# Patient Record
Sex: Male | Born: 1960
Health system: Southern US, Community
[De-identification: ages and names within clinical notes are randomized; demographics above are authoritative.]

## PROBLEM LIST (undated history)

## (undated) DIAGNOSIS — F419 Anxiety disorder, unspecified: Secondary | ICD-10-CM

## (undated) DIAGNOSIS — R310 Gross hematuria: Secondary | ICD-10-CM

## (undated) DIAGNOSIS — M549 Dorsalgia, unspecified: Secondary | ICD-10-CM

## (undated) DIAGNOSIS — M1990 Unspecified osteoarthritis, unspecified site: Secondary | ICD-10-CM

## (undated) HISTORY — DX: Gross hematuria: R31.0

## (undated) HISTORY — DX: Dorsalgia, unspecified: M54.9

## (undated) HISTORY — PX: CERVICAL SPINE SURGERY: SHX589

---

## 2007-09-16 ENCOUNTER — Ambulatory Visit: Payer: Self-pay | Admitting: Surgery

## 2007-09-21 ENCOUNTER — Ambulatory Visit: Payer: Self-pay | Admitting: Surgery

## 2012-06-28 ENCOUNTER — Ambulatory Visit: Payer: Self-pay | Admitting: Urology

## 2012-08-25 ENCOUNTER — Ambulatory Visit: Payer: Self-pay | Admitting: Urology

## 2012-08-30 ENCOUNTER — Ambulatory Visit: Payer: Self-pay | Admitting: Urology

## 2012-09-07 LAB — PATHOLOGY REPORT

## 2013-02-28 ENCOUNTER — Ambulatory Visit: Payer: Self-pay | Admitting: Family Medicine

## 2013-04-07 ENCOUNTER — Ambulatory Visit: Payer: Self-pay | Admitting: Gastroenterology

## 2013-12-15 HISTORY — PX: CARPAL TUNNEL RELEASE: SHX101

## 2014-07-24 ENCOUNTER — Ambulatory Visit: Payer: Self-pay | Admitting: Specialist

## 2015-04-03 NOTE — Op Note (Signed)
PATIENT NAME:  Duane Crawford, Duane Crawford MR#:  [id number] DATE OF BIRTH:  Sep 23, 1961  DATE OF PROCEDURE:  08/30/2012  PREOPERATIVE DIAGNOSIS: Epididymitis   POSTOPERATIVE DIAGNOSIS: Epididymitis.  OPERATION: Epididymectomy.   SURGEON: Rica KoyanagiJohn S. Brailey Buescher, MD  ASSISTANT: Ethlyn Galleryhristi Robson  ANESTHESIA: General.  INDICATIONS: This 54 year old man developed pain in the left testis which has persisted and become more severe. Antibiotics have controlled his symptoms temporarily but anti-inflammatories and antibiotics have failed to resolve his recurrent chronic pain in the left testis. He has a remote history of a vasectomy. After all considerations, he requests epididymectomy.   DESCRIPTION OF PROCEDURE: In the supine position, under general anesthesia, the lower abdomen and genital area was prepped and draped for surgery. An incision was made through the scrotal raphe into the left scrotal compartment exposing the testis and its surrounding structures. The epididymis was markedly engorged, particularly at the globus minor. A good deal of inflammatory fat had developed around the cord and epididymis. The epididymis was dissected from the testis, bleeding points cauterized or tied. Some of the fat was removed along with the epididymis and distal vas. The vas was removed at the site of prior vasectomy. The testis was then replaced in the scrotum and pexed to the septum with 4-0 silk sutures. The scrotal          layers were closed with continuous 4-0 chromic catgut and the scrotal skin with interrupted chromic catgut. A gauze bulky dressing with a scrotal support was applied. The patient tolerated the procedure well and returned to the recovery area in satisfactory condition. ____________________________ Rica KoyanagiJohn S. Levetta Bognar, MD jsh:slb D: 08/30/2012 14:28:43 ET T: 08/30/2012 14:45:47 ET JOB#: [id number]  cc: Rica KoyanagiJohn S. Steffany Schoenfelder, MD, <Dictator> Rica KoyanagiJOHN S Kaylynne Andres MD ELECTRONICALLY SIGNED 08/31/2012 12:33

## 2015-04-07 NOTE — Op Note (Signed)
PATIENT NAME:  Duane Crawford, Duane Crawford MR#:  [id number] DATE OF BIRTH:  1961-08-30  DATE OF PROCEDURE:  07/24/2014  PREOPERATIVE DIAGNOSIS: Advanced right carpal tunnel syndrome.   POSTOPERATIVE DIAGNOSIS:  Advanced right carpal tunnel syndrome.   OPERATION: Right carpal tunnel release.   SURGEON:  Valinda HoarHoward E Haydon Dorris, MD   ASSISTANT:   Cruz CondonAaron Powell , PA student   ANESTHESIA: General LMA.   COMPLICATIONS: None.   DRAINS: None.   ESTIMATED BLOOD LOSS: None.   REPLACED: None.   OPERATIVE PROCEDURE: The patient was brought to the Operating Room where he underwent satisfactory general LMA anesthesia in the supine position. The right arm was prepped and draped in sterile fashion. An Esmarch was applied. The tourniquet was inflated to 300 mmHg, tourniquet time was 23 minutes.   A longitudinal incision was made in the palm just the ulnar side of midline. Dissection was carried out bluntly through subcutaneous tissue, and a periosteal elevator used to free soft tissue off the volar aspect of the ligament. The Kelly clamp was passed beneath the volar ligament to protect the nerve, and the volar carpal ligament was divided sharply with a knife from distal to proximal. Carpal tunnel scissors were used to finish this proximally. The Kelly clamp was spread to make sure all its fibers had been then released. The nerve showed increased vascularity following release. The nerve was freed up from adhesions using a mosquito clamp. The motor branch was intact.  The superficial arc was followed ulnarly, and Guyon's canal was released as well.    The wound was then irrigated, and closed with running 4-0 nylon suture, 0.5 Marcaine was placed in the wound.  A dry, sterile compression hand dressing and volar splint was applied. The tourniquet was deflated with good return of blood flow to the hand.   The patient was awakened and taken to recovery in good condition.    ____________________________ Valinda HoarHoward E. Lisia Westbay,  MD hem:nt D: 07/24/2014 16:20:38 ET T: 07/24/2014 22:53:16 ET JOB#: [id number]  cc: Valinda HoarHoward E. Asmar Brozek, MD, <Dictator> Valinda HoarHOWARD E Daemian Gahm MD ELECTRONICALLY SIGNED 07/25/2014 14:57

## 2015-08-29 ENCOUNTER — Other Ambulatory Visit: Payer: Self-pay | Admitting: Specialist

## 2015-08-29 DIAGNOSIS — M5412 Radiculopathy, cervical region: Secondary | ICD-10-CM

## 2015-08-30 ENCOUNTER — Ambulatory Visit
Admission: RE | Admit: 2015-08-30 | Discharge: 2015-08-30 | Disposition: A | Discharge status: Home / Self Care | Payer: 59 | Source: Ambulatory Visit | Attending: Specialist | Admitting: Specialist

## 2015-08-30 DIAGNOSIS — M5412 Radiculopathy, cervical region: Secondary | ICD-10-CM

## 2015-09-06 ENCOUNTER — Ambulatory Visit
Admission: RE | Admit: 2015-09-06 | Discharge: 2015-09-06 | Disposition: A | Discharge status: Home / Self Care | Payer: 59 | Source: Ambulatory Visit | Attending: Specialist | Admitting: Specialist

## 2015-09-06 DIAGNOSIS — M5022 Other cervical disc displacement, mid-cervical region: Secondary | ICD-10-CM | POA: Insufficient documentation

## 2015-09-06 DIAGNOSIS — M5412 Radiculopathy, cervical region: Secondary | ICD-10-CM | POA: Insufficient documentation

## 2016-07-07 ENCOUNTER — Encounter: Payer: Self-pay | Admitting: Urology

## 2016-07-07 ENCOUNTER — Ambulatory Visit (INDEPENDENT_AMBULATORY_CARE_PROVIDER_SITE_OTHER): Payer: 59 | Admitting: Urology

## 2016-07-07 VITALS — BP 144/91 | HR 78 | Ht 71.0 in | Wt 266.2 lb

## 2016-07-07 DIAGNOSIS — R310 Gross hematuria: Secondary | ICD-10-CM

## 2016-07-07 DIAGNOSIS — Z125 Encounter for screening for malignant neoplasm of prostate: Secondary | ICD-10-CM

## 2016-07-07 LAB — URINALYSIS, COMPLETE
BILIRUBIN UA: NEGATIVE
GLUCOSE, UA: NEGATIVE
KETONES UA: NEGATIVE
NITRITE UA: NEGATIVE
Protein, UA: NEGATIVE
SPEC GRAV UA: 1.02 (ref 1.005–1.030)
UUROB: 1 mg/dL (ref 0.2–1.0)
pH, UA: 5.5 (ref 5.0–7.5)

## 2016-07-07 LAB — MICROSCOPIC EXAMINATION

## 2016-07-07 NOTE — Progress Notes (Signed)
07/07/2016 11:15 AM   Ashok Pall June 03, 1961 [id number]  Referring provider: No referring provider defined for this encounter.  Chief Complaint  Patient presents with  . Hematuria    blood in urine    HPI: Patient is a 55 -year-old Caucasian male who presents today with the complaint of gross hematuria.  He has been experiencing blood in his urine for 2 weeks.    He  does not have a prior history of recurrent urinary tract infections, nephrolithiasis, trauma to the genitourinary tract, BPH or malignancies of the genitourinary tract.   He does have a family medical history of nephrolithiasis, but there is no family history of malignancies of the genitourinary tract or hematuria.   Today, he are having symptoms of frequent urination, urgency, dysuria, nocturia, incontinence, hesitancy, intermittency, straining to urinate and a weak urinary stream.  His UA today demonstrates >30 RBC's/hpf.  He is experiencing right  flank pain. He denies any recent fevers, chills, nausea or vomiting.   He does not have any recent imaging studies.   He is not a smoker.  He is exposed to secondhand smoke.  He is a Psychologist, occupational.     PMH: Past Medical History:  Diagnosis Date  . Back pain   . Gross hematuria     Surgical History: Past Surgical History:  Procedure Laterality Date  . CARPAL TUNNEL RELEASE Right 2015  . CERVICAL SPINE SURGERY      Home Medications:    Medication List       Accurate as of 07/07/16 11:15 AM. Always use your most recent med list.          azithromycin 250 MG tablet Commonly known as:  ZITHROMAX Take 2 tablets (500mg ) by mouth on Day 1. Take 1 tablet (250mg ) by mouth on Days 2-5.   fluticasone 50 MCG/ACT nasal spray Commonly known as:  FLONASE Place into the nose.       Allergies:  Allergies  Allergen Reactions  . Etodolac Rash and Shortness Of Breath    Family History: Family History  Problem Relation Age of Onset  . Prostate cancer Neg  Hx   . Kidney disease Neg Hx     Social History:  reports that he has never smoked. He has never used smokeless tobacco. He reports that he does not drink alcohol or use drugs.  ROS: UROLOGY Frequent Urination?: No Hard to postpone urination?: Yes Burning/pain with urination?: Yes Get up at night to urinate?: No Leakage of urine?: Yes Urine stream starts and stops?: Yes Trouble starting stream?: Yes Do you have to strain to urinate?: Yes Blood in urine?: Yes Urinary tract infection?: No Sexually transmitted disease?: No Injury to kidneys or bladder?: No Painful intercourse?: No Weak stream?: Yes Erection problems?: Yes Penile pain?: No  Gastrointestinal Nausea?: No Vomiting?: No Indigestion/heartburn?: No Diarrhea?: No Constipation?: No  Constitutional Fever: No Night sweats?: No Weight loss?: No Fatigue?: No  Skin Skin rash/lesions?: No Itching?: No  Eyes Blurred vision?: Yes Double vision?: No  Ears/Nose/Throat Sore throat?: No Sinus problems?: Yes  Hematologic/Lymphatic Swollen glands?: No Easy bruising?: No  Cardiovascular Leg swelling?: No Chest pain?: No  Respiratory Cough?: No Shortness of breath?: No  Endocrine Excessive thirst?: No  Musculoskeletal Back pain?: Yes Joint pain?: No  Neurological Headaches?: No Dizziness?: Yes  Psychologic Depression?: No Anxiety?: No  Physical Exam: BP (!) 144/91   Pulse 78   Ht 5\' 11"  (1.803 m)   Wt 266 lb 3.2 oz (  120.7 kg)   BMI 37.13 kg/m   Constitutional: Well nourished. Alert and oriented, No acute distress. HEENT: [place] AT, moist mucus membranes. Trachea midline, no masses. Cardiovascular: No clubbing, cyanosis, or edema. Respiratory: Normal respiratory effort, no increased work of breathing. GI: Abdomen is soft, non tender, non distended, no abdominal masses. Liver and spleen not palpable.  No hernias appreciated.  Stool sample for occult testing is not indicated.   GU: No CVA  tenderness.  No bladder fullness or masses.  Patient with circumcised phallus.  Urethral meatus is patent.  No penile discharge. No penile lesions or rashes. Scrotum without lesions, cysts, rashes and/or edema.  Testicles are located scrotally bilaterally. No masses are appreciated in the testicles. Left and right epididymis are normal. Rectal: Patient with  normal sphincter tone. Anus and perineum without scarring or rashes. No rectal masses are appreciated. Prostate is approximately 55 grams, no nodules are appreciated. Seminal vesicles are normal. Skin: No rashes, bruises or suspicious lesions. Lymph: No cervical or inguinal adenopathy. Neurologic: Grossly intact, no focal deficits, moving all 4 extremities. Psychiatric: Normal mood and affect.  Laboratory Data: Urinalysis Significant for >30RBC's/hpf.  See EPIC.    Assessment & Plan:    1. Gross hematuria:    I explained to the patient that there are a number of causes that can be associated with blood in the urine, such as stones, BPH, UTI's, damage to the urinary tract and/or cancer.  At this time, I felt that the patient warranted further urologic evaluation.   The AUA guidelines state that a CT urogram is the preferred imaging study to evaluate hematuria.  I explained to the patient that a contrast material will be injected into a vein and that in rare instances, an allergic reaction can result and may even life threatening   The patient denies any allergies to contrast, iodine and/or seafood and is not taking metformin.  Following the imaging study,  I've recommended a cystoscopy. I described how this is performed, typically in an office setting with a flexible cystoscope. We described the risks, benefits, and possible side effects, the most common of which is a minor amount of blood in the urine and/or burning which usually resolves in 24 to 48 hours.    The patient had the opportunity to ask questions which were answered. Based upon  this discussion, the patient is willing to proceed. Therefore, I've ordered: a CT Urogram and cystoscopy.  He will return following all of the above for discussion of the results.     2. PSA screening:   Patient has not had a PSA in over one year.  He would like one drawn today.     Return for CT Urogram report and cystoscopy.  These notes generated with voice recognition software. I apologize for typographical errors.  Michiel Cowboy, PA-C  [facility] [address] [place], [place] [address] [phone]

## 2016-07-07 NOTE — Patient Instructions (Addendum)
Hematuria, Adult Hematuria is blood in your urine. It can be caused by a bladder infection, kidney infection, prostate infection, kidney stone, or cancer of your urinary tract. Infections can usually be treated with medicine, and a kidney stone usually will pass through your urine. If neither of these is the cause of your hematuria, further workup to find out the reason may be needed. It is very important that you tell your health care provider about any blood you see in your urine, even if the blood stops without treatment or happens without causing pain. Blood in your urine that happens and then stops and then happens again can be a symptom of a very serious condition. Also, pain is not a symptom in the initial stages of many urinary cancers. HOME CARE INSTRUCTIONS   Drink lots of fluid, 3-4 quarts a day. If you have been diagnosed with an infection, cranberry juice is especially recommended, in addition to large amounts of water.  Avoid caffeine, tea, and carbonated beverages because they tend to irritate the bladder.  Avoid alcohol because it may irritate the prostate.  Take all medicines as directed by your health care provider.  If you were prescribed an antibiotic medicine, finish it all even if you start to feel better.  If you have been diagnosed with a kidney stone, follow your health care provider's instructions regarding straining your urine to catch the stone.  Empty your bladder often. Avoid holding urine for long periods of time.  After a bowel movement, women should cleanse front to back. Use each tissue only once.  Empty your bladder before and after sexual intercourse if you are a male. SEEK MEDICAL CARE IF:  You develop back pain.  You have a fever.  You have a feeling of sickness in your stomach (nausea) or vomiting.  Your symptoms are not better in 3 days. Return sooner if you are getting worse. SEEK IMMEDIATE MEDICAL CARE IF:   You develop severe vomiting and  are unable to keep the medicine down.  You develop severe back or abdominal pain despite taking your medicines.  You begin passing a large amount of blood or clots in your urine.  You feel extremely weak or faint, or you pass out. MAKE SURE YOU:   Understand these instructions.  Will watch your condition.  Will get help right away if you are not doing well or get worse.   This information is not intended to replace advice given to you by your health care provider. Make sure you discuss any questions you have with your health care provider.   Document Released: 12/01/2005 Document Revised: 12/22/2014 Document Reviewed: 08/01/2013 Elsevier Interactive Patient Education 2016 [employer]  CT Scan A computed tomography (CT) scan is a specialized X-ray scan. It uses X-rays and a computer to make pictures of different areas of your body. A CT scan can offer more detailed information than a regular X-ray exam. The CT scan provides data about internal organs, soft tissue structures, blood vessels, and bones.  The CT scanner is a large machine that takes pictures of your body as you move through the opening.  LET YOUR HEALTH CARE PROVIDER KNOW ABOUT:  Any allergies you have.   All medicines you are taking, including vitamins, herbs, eye drops, creams, and over-the-counter medicines.   Previous problems you or members of your family have had with the use of anesthetics.   Any blood disorders you have.   Previous surgeries you have had.   Medical   conditions you have. RISKS AND COMPLICATIONS  Generally, this is a safe procedure. However, as with any procedure, problems can occur. Possible problems include:   An allergic reaction to the contrast material.   Development of cancer from excessive exposure to radiation. The risk of this is small.  BEFORE THE PROCEDURE   The day before the test, stop drinking caffeinated beverages. These include energy drinks, tea, soda, coffee,  and hot chocolate.   On the day of the test:  About 4 hours before the test, stop eating and drinking anything but water as advised by your health care provider.   Avoid wearing jewelry. You will have to partly or fully undress and wear a hospital gown. PROCEDURE   You will be asked to lie on a table with your arms above your head.   If contrast dye is to be used for the test, an IV tube will be inserted in your arm. The contrast dye will be injected into the IV tube. You might feel warm, or you may get a metallic taste in your mouth.   The table you will be lying on will move into a large machine that will do the scanning.   You will be able to see, hear, and talk to the person running the machine while you are in it. Follow that person's directions.   The CT machine will move around you to take pictures. Do not move while it is scanning. This helps to get a good image.   When the best possible pictures have been taken, the machine will be turned off. The table will be moved out of the machine. The IV tube will then be removed. AFTER THE PROCEDURE  Ask your health care provider when to follow up for your test results.   This information is not intended to replace advice given to you by your health care provider. Make sure you discuss any questions you have with your health care provider.   Document Released: 01/08/2005 Document Revised: 12/06/2013 Document Reviewed: 08/08/2013 Elsevier Interactive Patient Education 2016 Else[employer] Inc.  Cystoscopy Cystoscopy is a procedure that is used to help your caregiver diagnose and sometimes treat conditions that affect your lower urinary tract. Your lower urinary tract includes your bladder and the tube through which urine passes from your bladder out of your body (urethra). Cystoscopy is performed with a thin, tube-shaped instrument (cystoscope). The cystoscope has lenses and a light at the end so that your caregiver can see inside your  bladder. The cystoscope is inserted at the entrance of your urethra. Your caregiver guides it through your urethra and into your bladder. There are two main types of cystoscopy:  Flexible cystoscopy (with a flexible cystoscope).  Rigid cystoscopy (with a rigid cystoscope). Cystoscopy may be recommended for many conditions, including:  Urinary tract infections.  Blood in your urine (hematuria).  Loss of bladder control (urinary incontinence) or overactive bladder.  Unusual cells found in a urine sample.  Urinary blockage.  Painful urination. Cystoscopy may also be done to remove a sample of your tissue to be checked under a microscope (biopsy). It may also be done to remove or destroy bladder stones. LET YOUR CAREGIVER KNOW ABOUT:  Allergies to food or medicine.  Medicines taken, including vitamins, herbs, eyedrops, over-the-counter medicines, and creams.  Use of steroids (by mouth or creams).  Previous problems with anesthetics or numbing medicines.  History of bleeding problems or blood clots.  Previous surgery.  Other health problems, including diabetes and   kidney problems.  Possibility of pregnancy, if this applies. PROCEDURE The area around the opening to your urethra will be cleaned. A medicine to numb your urethra (local anesthetic) is used. If a tissue sample or stone is removed during the procedure, you may be given a medicine to make you sleep (general anesthetic). Your caregiver will gently insert the tip of the cystoscope into your urethra. The cystoscope will be slowly glided through your urethra and into your bladder. Sterile fluid will flow through the cystoscope and into your bladder. The fluid will expand and stretch your bladder. This gives your caregiver a better view of your bladder walls. The procedure lasts about 15-20 minutes. AFTER THE PROCEDURE If a local anesthetic is used, you will be allowed to go home as soon as you are ready. If a general  anesthetic is used, you will be taken to a recovery area until you are stable. You may have temporary bleeding and burning on urination.   This information is not intended to replace advice given to you by your health care provider. Make sure you discuss any questions you have with your health care provider.   Document Released: 11/28/2000 Document Revised: 12/22/2014 Document Reviewed: 05/24/2012 Elsevier Interactive Patient Education 2016 Elsevier Inc.  

## 2016-07-08 ENCOUNTER — Telehealth: Payer: Self-pay

## 2016-07-08 LAB — BUN+CREAT
BUN/Creatinine Ratio: 15 (ref 9–20)
BUN: 13 mg/dL (ref 6–24)
Creatinine, Ser: 0.85 mg/dL (ref 0.76–1.27)
GFR calc Af Amer: 115 mL/min/{1.73_m2} (ref >59)
GFR calc non Af Amer: 99 mL/min/{1.73_m2} (ref >59)

## 2016-07-08 LAB — PSA: Prostate Specific Ag, Serum: 0.6 ng/mL (ref 0.0–4.0)

## 2016-07-08 NOTE — Telephone Encounter (Signed)
-----   Message from Shannon A McGowan, PA-C sent at 07/08/2016  8:10 AM EDT ----- Labs are normal.  Proceed with CT Urogram. 

## 2016-07-08 NOTE — Telephone Encounter (Signed)
Mailbox full

## 2016-07-09 LAB — CULTURE, URINE COMPREHENSIVE

## 2016-07-09 NOTE — Telephone Encounter (Signed)
No answer. Mailbox full. 

## 2016-07-15 NOTE — Telephone Encounter (Signed)
-----   Message from Harle Battiest, PA-C sent at 07/08/2016  8:10 AM EDT ----- Labs are normal.  Proceed with CT Urogram.

## 2016-07-15 NOTE — Telephone Encounter (Signed)
Can't get in touch with patient message box always full and can't leave message Letter to contact office sent.

## 2016-07-21 ENCOUNTER — Telehealth: Payer: Self-pay | Admitting: Urology

## 2016-07-21 ENCOUNTER — Ambulatory Visit
Admission: RE | Admit: 2016-07-21 | Discharge: 2016-07-21 | Disposition: A | Discharge status: Home / Self Care | Payer: 59 | Source: Ambulatory Visit | Attending: Urology | Admitting: Urology

## 2016-07-21 DIAGNOSIS — R310 Gross hematuria: Secondary | ICD-10-CM | POA: Diagnosis present

## 2016-07-21 DIAGNOSIS — N289 Disorder of kidney and ureter, unspecified: Secondary | ICD-10-CM | POA: Insufficient documentation

## 2016-07-21 DIAGNOSIS — I700 Atherosclerosis of aorta: Secondary | ICD-10-CM | POA: Insufficient documentation

## 2016-07-21 MED ORDER — IOPAMIDOL (ISOVUE-300) INJECTION 61%
125.0000 mL | Freq: Once | INTRAVENOUS | Status: AC | PRN
  Administered 2016-07-21: 125 mL via INTRAVENOUS

## 2016-07-21 NOTE — Telephone Encounter (Signed)
Spoke with patient and he is scheduled for his ct scan today and will have cysto on 07-25-16  michelle

## 2016-07-25 ENCOUNTER — Ambulatory Visit (INDEPENDENT_AMBULATORY_CARE_PROVIDER_SITE_OTHER): Payer: 59 | Admitting: Urology

## 2016-07-25 ENCOUNTER — Encounter: Payer: Self-pay | Admitting: Urology

## 2016-07-25 VITALS — BP 128/82 | HR 75 | Ht 71.0 in | Wt 271.2 lb

## 2016-07-25 DIAGNOSIS — R310 Gross hematuria: Secondary | ICD-10-CM

## 2016-07-25 DIAGNOSIS — Z125 Encounter for screening for malignant neoplasm of prostate: Secondary | ICD-10-CM | POA: Diagnosis not present

## 2016-07-25 DIAGNOSIS — N400 Enlarged prostate without lower urinary tract symptoms: Secondary | ICD-10-CM | POA: Diagnosis not present

## 2016-07-25 LAB — URINALYSIS, COMPLETE
BILIRUBIN UA: NEGATIVE
Glucose, UA: NEGATIVE
KETONES UA: NEGATIVE
LEUKOCYTES UA: NEGATIVE
Nitrite, UA: NEGATIVE
PROTEIN UA: NEGATIVE
SPEC GRAV UA: 1.015 (ref 1.005–1.030)
Urobilinogen, Ur: 0.2 mg/dL (ref 0.2–1.0)
pH, UA: 8.5 — ABNORMAL HIGH (ref 5.0–7.5)

## 2016-07-25 LAB — MICROSCOPIC EXAMINATION: Bacteria, UA: NONE SEEN

## 2016-07-25 MED ORDER — LIDOCAINE HCL 2 % EX GEL
1.0000 "application " | Freq: Once | CUTANEOUS | Status: AC
  Administered 2016-07-25: 1 via URETHRAL

## 2016-07-25 MED ORDER — FINASTERIDE 5 MG PO TABS
5.0000 mg | ORAL_TABLET | Freq: Every day | ORAL | 11 refills | Status: DC
Start: 2016-07-25 — End: 2019-12-06

## 2016-07-25 MED ORDER — CIPROFLOXACIN HCL 500 MG PO TABS
500.0000 mg | ORAL_TABLET | Freq: Once | ORAL | Status: AC
  Administered 2016-07-25: 500 mg via ORAL

## 2016-07-25 NOTE — Progress Notes (Signed)
07/25/2016 10:59 AM   Ashok Pall [birth year] [id number]  Referring provider: Danella Penton, MD [phone] [facility] [address] [facility] [place], [place] [address]  Chief Complaint  Patient presents with  . Cysto    HPI: Patient is a 55 -year-old Caucasian male who presents today with the complaint of gross hematuria.  He has been experiencing blood in his urine for 2 weeks.    He  does not have a prior history of recurrent urinary tract infections, nephrolithiasis, trauma to the genitourinary tract, BPH or malignancies of the genitourinary tract.   He does have a family medical history of nephrolithiasis, but there is no family history of malignancies of the genitourinary tract or hematuria.   Today, he are having symptoms of frequent urination, urgency, dysuria, nocturia, incontinence, hesitancy, intermittency, straining to urinate and a weak urinary stream.  His UA today demonstrates >30 RBC's/hpf.  He is experiencing right  flank pain. He denies any recent fevers, chills, nausea or vomiting.   He does not have any recent imaging studies.   He is not a smoker.  He is exposed to secondhand smoke.  He is a Psychologist, occupational.    CT Urogram was unremarkable for source of gross hematuria.    The patient has a new complaint also of nocturia 23. He also has a weak stream. He does not feel His bladder. This is beginning worse over a number of years. He has never taken medications for BPH before.   PMH: Past Medical History:  Diagnosis Date  . Back pain   . Gross hematuria     Surgical History: Past Surgical History:  Procedure Laterality Date  . CARPAL TUNNEL RELEASE Right 2015  . CERVICAL SPINE SURGERY      Home Medications:    Medication List       Accurate as of 07/25/16 10:59 AM. Always use your most recent med list.          azithromycin 250 MG tablet Commonly known as:  ZITHROMAX Take 2 tablets (500mg ) by mouth on Day 1. Take 1 tablet (250mg )  by mouth on Days 2-5.   finasteride 5 MG tablet Commonly known as:  PROSCAR Take 1 tablet (5 mg total) by mouth daily.   fluticasone 50 MCG/ACT nasal spray Commonly known as:  FLONASE Place into the nose.       Allergies:  Allergies  Allergen Reactions  . Etodolac Rash and Shortness Of Breath    Family History: Family History  Problem Relation Age of Onset  . Prostate cancer Neg Hx   . Kidney disease Neg Hx     Social History:  reports that he has never smoked. He has never used smokeless tobacco. He reports that he does not drink alcohol or use drugs.  ROS:                                        Physical Exam: BP 128/82 (BP Location: Right Arm, Patient Position: Sitting, Cuff Size: Large)   Pulse 75   Ht 5\' 11"  (1.803 m)   Wt 271 lb 3.2 oz (123 kg)   BMI 37.82 kg/m   Constitutional:  Alert and oriented, No acute distress. HEENT: [place] AT, moist mucus membranes.  Trachea midline, no masses. Cardiovascular: No clubbing, cyanosis, or edema. Respiratory: Normal respiratory effort, no increased work of breathing. GI: Abdomen is soft, nontender,  nondistended, no abdominal masses GU: No CVA tenderness.  Skin: No rashes, bruises or suspicious lesions. Lymph: No cervical or inguinal adenopathy. Neurologic: Grossly intact, no focal deficits, moving all 4 extremities. Psychiatric: Normal mood and affect.  Laboratory Data: No results found for: WBC, HGB, HCT, MCV, PLT  Lab Results  Component Value Date   CREATININE 0.85 07/07/2016    No results found for: PSA  No results found for: TESTOSTERONE  No results found for: HGBA1C  Urinalysis    Component Value Date/Time   APPEARANCEUR Cloudy (A) 07/07/2016 1100   GLUCOSEU Negative 07/07/2016 1100   BILIRUBINUR Negative 07/07/2016 1100   PROTEINUR Negative 07/07/2016 1100   NITRITE Negative 07/07/2016 1100   LEUKOCYTESUR Trace (A) 07/07/2016 1100    Pertinent Imaging: CLINICAL DATA:   55 year old male with history of gross hematuria and low back pain for the past 4 weeks, recently resolved.  EXAM: CT ABDOMEN AND PELVIS WITHOUT AND WITH CONTRAST  TECHNIQUE: Multidetector CT imaging of the abdomen and pelvis was performed following the standard protocol before and following the bolus administration of intravenous contrast.  CONTRAST:  ISOVUE-300 IOPAMIDOL (ISOVUE-300) INJECTION 61%  COMPARISON:  No priors.  FINDINGS: Lower chest:  Unremarkable.  Hepatobiliary: Several tiny calcified granulomas are noted scattered throughout the liver. No suspicious cystic or solid hepatic lesions. No intra or extrahepatic biliary ductal dilatation. Gallbladder is normal in appearance.  Pancreas: No pancreatic mass. No pancreatic ductal dilatation. No pancreatic or peripancreatic fluid or inflammatory changes.  Spleen: Unremarkable.  Adrenals/Urinary Tract: No calcifications are noted within the collecting system of either kidney, along the course of either ureter, or within the lumen of the urinary bladder. No hydroureteronephrosis or perinephric stranding to indicate urinary tract obstruction at this time. 6 mm and low-attenuation lesion in the anterior aspect of the lower pole of the left kidney is too small to definitively characterize, but is statistically likely a tiny cyst. No other suspicious renal lesions are noted. Postcontrast delayed images demonstrate no definite filling defect within the collecting system of either kidney, along the course of either ureter, or within the lumen of the urinary bladder. Urinary bladder is normal in appearance. Bilateral adrenal glands are normal in appearance.  Stomach/Bowel: Normal appearance of the stomach. Small duodenal diverticulum off the medial aspect of the second portion of the duodenum incidentally noted. No surrounding inflammatory changes. No pathologic dilatation of small bowel or colon. Normal  appendix.  Vascular/Lymphatic: Aortic atherosclerosis, without evidence of aneurysm or dissection in the abdominal or pelvic vasculature. No lymphadenopathy noted in the abdomen or pelvis.  Reproductive: Prostate gland and seminal vesicles are unremarkable in appearance.  Other: No significant volume of ascites.  No pneumoperitoneum.  Musculoskeletal: There are no aggressive appearing lytic or blastic lesions noted in the visualized portions of the skeleton.  IMPRESSION: 1. No definite source for gross hematuria identified on today's examination. 2. No acute findings in the abdomen or pelvis to account for the patient's history of back pain. 3. 6 mm low-attenuation lesion in the anterior aspect of the lower pole the left kidney is too small to definitively characterize, but is statistically likely a tiny cyst. 4. Aortic atherosclerosis. 5. Normal appendix. 6. Additional incidental findings, as above.   Cystoscopy Procedure Note  Patient identification was confirmed, informed consent was obtained, and patient was prepped using Betadine solution.  Lidocaine jelly was administered per urethral meatus.    Preoperative abx where received prior to procedure.     Pre-Procedure: - Inspection  reveals a normal caliber ureteral meatus.  Procedure: The flexible cystoscope was introduced without difficulty - No urethral strictures/lesions are present. - Enlarged prostate  - Hypervascular prostate - Normal bladder neck - Bilateral ureteral orifices identified - Bladder mucosa  reveals no ulcers, tumors, or lesions - No bladder stones - No trabeculation  Retroflexion shows no intravesical lobe.    Post-Procedure: - Patient tolerated the procedure well   Assessment & Plan:    1. Gross hematuria -Negative work up except for hypervascular prostate -Will need repeat urinalysis in one year  2. BPH -We'll start the patient on finasteride for his urinary symptoms. This  will not only help with his urinary symptoms but his hypervascular prostate. -Follow up in 3 months to assess his progress.  3. Prostate cancer screening Up today. Will need repeat PSA/DRE in July 2018.  Return in about 3 months (around 10/25/2016).  Hildred LaserBrian James Tameah Mihalko, MD  [facility] [address], [address] [place], [place] [address] [phone]

## 2016-10-23 ENCOUNTER — Ambulatory Visit (INDEPENDENT_AMBULATORY_CARE_PROVIDER_SITE_OTHER): Payer: 59 | Admitting: Urology

## 2016-10-23 ENCOUNTER — Encounter: Payer: Self-pay | Admitting: Urology

## 2016-10-23 VITALS — BP 150/95 | HR 85 | Ht 71.0 in | Wt 260.4 lb

## 2016-10-23 DIAGNOSIS — N400 Enlarged prostate without lower urinary tract symptoms: Secondary | ICD-10-CM

## 2016-10-23 LAB — URINALYSIS, COMPLETE
BILIRUBIN UA: NEGATIVE
Glucose, UA: NEGATIVE
KETONES UA: NEGATIVE
Leukocytes, UA: NEGATIVE
NITRITE UA: NEGATIVE
PH UA: 8.5 — AB (ref 5.0–7.5)
Protein, UA: NEGATIVE
RBC UA: NEGATIVE
SPEC GRAV UA: 1.02 (ref 1.005–1.030)
UUROB: 0.2 mg/dL (ref 0.2–1.0)

## 2016-10-23 LAB — BLADDER SCAN AMB NON-IMAGING: Scan Result: 0

## 2016-10-23 LAB — MICROSCOPIC EXAMINATION: BACTERIA UA: NONE SEEN

## 2016-10-23 MED ORDER — TAMSULOSIN HCL 0.4 MG PO CAPS: 0.4000 mg | ORAL_CAPSULE | Freq: Every day | ORAL | 11 refills | Status: DC

## 2016-10-23 NOTE — Progress Notes (Signed)
10/23/2016 9:16 AM   Duane Crawford 08/11/1961 [id number]  Referring provider: Danella PentonMark F Miller, MD [phone] [id number] [facility] [address] [facility] [place], [place] [id number]  Chief Complaint  Patient presents with  . Benign Prostatic Hypertrophy    HPI: The patient is a 55 year old gentleman who presents for follow-up. Next  1. BPH The patient was started on finasteride for his urinary symptoms in August 2017. I PSS today is 13/6. Complains mostly of intermittency and weak stream. He has nocturia 1. He has some incomplete emptying and urgency. He has mild frequency and straining. He rates his quality of life from urinary symptoms as terrible. He does note mild improvement though with the finasteride but feels his urinary symptoms could be better.  2. Gross hematuria  -negative workup in August 2017 except for a hypervascular prostate. He was placed on finasteride at this time for both his hypervascular prostate causing gross hematuria and his urinary symptoms.  3. Prostate cancer screening Due for repeat PSA and DRE in July 2018.   PMH: Past Medical History:  Diagnosis Date  . Back pain   . Gross hematuria     Surgical History: Past Surgical History:  Procedure Laterality Date  . CARPAL TUNNEL RELEASE Right 2015  . CERVICAL SPINE SURGERY      Home Medications:    Medication List       Accurate as of 10/23/16  9:16 AM. Always use your most recent med list.          azithromycin 250 MG tablet Commonly known as:  ZITHROMAX Take 2 tablets (500mg ) by mouth on Day 1. Take 1 tablet (250mg ) by mouth on Days 2-5.   finasteride 5 MG tablet Commonly known as:  PROSCAR Take 1 tablet (5 mg total) by mouth daily.   fluticasone 50 MCG/ACT nasal spray Commonly known as:  FLONASE Place into the nose.   tamsulosin 0.4 MG Caps capsule Commonly known as:  FLOMAX Take 1 capsule (0.4 mg total) by mouth daily.       Allergies:  Allergies  Allergen Reactions  .  Etodolac Rash and Shortness Of Breath    Family History: Family History  Problem Relation Age of Onset  . Prostate cancer Neg Hx   . Kidney disease Neg Hx     Social History:  reports that he has never smoked. He has never used smokeless tobacco. He reports that he does not drink alcohol or use drugs.  ROS: UROLOGY Frequent Urination?: No Hard to postpone urination?: No Burning/pain with urination?: No Get up at night to urinate?: No Leakage of urine?: No Urine stream starts and stops?: No Trouble starting stream?: No Do you have to strain to urinate?: No Blood in urine?: No Urinary tract infection?: No Sexually transmitted disease?: No Injury to kidneys or bladder?: No Painful intercourse?: No Weak stream?: No Erection problems?: No Penile pain?: No  Gastrointestinal Nausea?: No Vomiting?: No Indigestion/heartburn?: No Diarrhea?: No Constipation?: No  Constitutional Fever: No Night sweats?: No Weight loss?: No Fatigue?: No  Skin Skin rash/lesions?: No Itching?: No  Eyes Blurred vision?: No Double vision?: No  Ears/Nose/Throat Sore throat?: No Sinus problems?: No  Hematologic/Lymphatic Swollen glands?: No Easy bruising?: No  Cardiovascular Leg swelling?: No Chest pain?: No  Respiratory Cough?: No Shortness of breath?: No  Endocrine Excessive thirst?: No  Musculoskeletal Back pain?: No Joint pain?: No  Neurological Headaches?: No Dizziness?: No  Psychologic Depression?: No Anxiety?: No  Physical Exam: BP (!) 150/95 (BP  Location: Left Arm, Patient Position: Sitting, Cuff Size: Large)   Pulse 85   Ht 5\' 11"  (1.803 m)   Wt 260 lb 6.4 oz (118.1 kg)   BMI 36.32 kg/m   Constitutional:  Alert and oriented, No acute distress. HEENT: [place] AT, moist mucus membranes.  Trachea midline, no masses. Cardiovascular: No clubbing, cyanosis, or edema. Respiratory: Normal respiratory effort, no increased work of breathing. GI: Abdomen is soft,  nontender, nondistended, no abdominal masses GU: No CVA tenderness.  Skin: No rashes, bruises or suspicious lesions. Lymph: No cervical or inguinal adenopathy. Neurologic: Grossly intact, no focal deficits, moving all 4 extremities. Psychiatric: Normal mood and affect.  Laboratory Data: No results found for: WBC, HGB, HCT, MCV, PLT  Lab Results  Component Value Date   CREATININE 0.85 07/07/2016    No results found for: PSA  No results found for: TESTOSTERONE  No results found for: HGBA1C  Urinalysis    Component Value Date/Time   APPEARANCEUR Clear 07/25/2016 1027   GLUCOSEU Negative 07/25/2016 1027   BILIRUBINUR Negative 07/25/2016 1027   PROTEINUR Negative 07/25/2016 1027   NITRITE Negative 07/25/2016 1027   LEUKOCYTESUR Negative 07/25/2016 1027    Assessment & Plan:    1. BPH -continue finasteride -will add flomax 0.4 mg dialy -follow up in 3 months to assess symptoms  2. Gross hematuria Negative work up except for hypervascular prostate in August 2017  3. Prostate cancer screening Up to date. Will need repeat PSA/DRE in July 2018   Return in about 3 months (around 01/23/2017).  Hildred LaserBrian James Layloni Fahrner, MD  [facility] [address], [address] [place], [place] [address] [phone]

## 2017-01-23 ENCOUNTER — Ambulatory Visit (INDEPENDENT_AMBULATORY_CARE_PROVIDER_SITE_OTHER): Payer: 59 | Admitting: Urology

## 2017-01-23 ENCOUNTER — Encounter: Payer: Self-pay | Admitting: Urology

## 2017-01-23 VITALS — BP 127/84 | HR 77 | Ht 71.0 in | Wt 263.0 lb

## 2017-01-23 DIAGNOSIS — N400 Enlarged prostate without lower urinary tract symptoms: Secondary | ICD-10-CM | POA: Diagnosis not present

## 2017-01-23 DIAGNOSIS — R310 Gross hematuria: Secondary | ICD-10-CM | POA: Diagnosis not present

## 2017-01-23 NOTE — Progress Notes (Signed)
01/23/2017 2:32 PM   Ashok Pall 1961/05/11 [id number]  Referring provider: Danella Penton, MD [phone] [facility] [address] [facility] [place], [place] [address]  Chief Complaint  Patient presents with  . Follow-up    BPH    HPI: The patient is a 56 year old gentleman who presents for follow-up. Next  1. BPH The patient was started on finasteride for his urinary symptoms in August 2017. Flomax 0.4 mg was added in November 2017. IPSS 2/3. Symptoms dramatically improved. Good stream. No nocturia. Feels like bladder is empty.  2. Gross hematuria  -negative workup in August 2017 except for a hypervascular prostate. He was placed on finasteride at this time for both his hypervascular prostate causing gross hematuria and his urinary symptoms. Has had intermittent small amounts of post void hematuria three times since he was last seen. No hematuria in over 4 weeks.  3. Prostate cancer screening Up to date currently.   PMH: Past Medical History:  Diagnosis Date  . Back pain   . Gross hematuria     Surgical History: Past Surgical History:  Procedure Laterality Date  . CARPAL TUNNEL RELEASE Right 2015  . CERVICAL SPINE SURGERY      Home Medications:  Allergies as of 01/23/2017      Reactions   Etodolac Rash, Shortness Of Breath      Medication List       Accurate as of 01/23/17  2:32 PM. Always use your most recent med list.          azithromycin 250 MG tablet Commonly known as:  ZITHROMAX Take 2 tablets (500mg ) by mouth on Day 1. Take 1 tablet (250mg ) by mouth on Days 2-5.   finasteride 5 MG tablet Commonly known as:  PROSCAR Take 1 tablet (5 mg total) by mouth daily.   fluticasone 50 MCG/ACT nasal spray Commonly known as:  FLONASE Place into the nose.   tamsulosin 0.4 MG Caps capsule Commonly known as:  FLOMAX Take 1 capsule (0.4 mg total) by mouth daily.       Allergies:  Allergies  Allergen Reactions  . Etodolac Rash and  Shortness Of Breath    Family History: Family History  Problem Relation Age of Onset  . Prostate cancer Neg Hx   . Kidney disease Neg Hx     Social History:  reports that he has never smoked. He has never used smokeless tobacco. He reports that he does not drink alcohol or use drugs.  ROS: UROLOGY Frequent Urination?: No Hard to postpone urination?: No Burning/pain with urination?: No Get up at night to urinate?: No Leakage of urine?: No Urine stream starts and stops?: No Trouble starting stream?: No Do you have to strain to urinate?: No Blood in urine?: Yes Urinary tract infection?: No Sexually transmitted disease?: No Injury to kidneys or bladder?: No Painful intercourse?: No Weak stream?: Yes Erection problems?: No Penile pain?: No  Gastrointestinal Nausea?: No Vomiting?: No Indigestion/heartburn?: No Diarrhea?: No Constipation?: No  Constitutional Fever: No Night sweats?: No Weight loss?: No Fatigue?: No  Skin Skin rash/lesions?: No Itching?: No  Eyes Blurred vision?: No Double vision?: No  Ears/Nose/Throat Sore throat?: No Sinus problems?: Yes  Hematologic/Lymphatic Swollen glands?: No Easy bruising?: No  Cardiovascular Leg swelling?: No Chest pain?: No  Respiratory Cough?: No Shortness of breath?: No  Endocrine Excessive thirst?: No  Musculoskeletal Back pain?: Yes Joint pain?: No  Neurological Headaches?: No Dizziness?: Yes  Psychologic Depression?: No Anxiety?: No  Physical Exam:  BP 127/84   Pulse 77   Ht 5\' 11"  (1.803 m)   Wt 263 lb (119.3 kg)   BMI 36.68 kg/m   Constitutional:  Alert and oriented, No acute distress. HEENT: [place] AT, moist mucus membranes.  Trachea midline, no masses. Cardiovascular: No clubbing, cyanosis, or edema. Respiratory: Normal respiratory effort, no increased work of breathing. GI: Abdomen is soft, nontender, nondistended, no abdominal masses GU: No CVA tenderness.  Skin: No rashes, bruises  or suspicious lesions. Lymph: No cervical or inguinal adenopathy. Neurologic: Grossly intact, no focal deficits, moving all 4 extremities. Psychiatric: Normal mood and affect.  Laboratory Data: No results found for: WBC, HGB, HCT, MCV, PLT  Lab Results  Component Value Date   CREATININE 0.85 07/07/2016    No results found for: PSA  No results found for: TESTOSTERONE  No results found for: HGBA1C  Urinalysis    Component Value Date/Time   APPEARANCEUR Clear 10/23/2016 0836   GLUCOSEU Negative 10/23/2016 0836   BILIRUBINUR Negative 10/23/2016 0836   PROTEINUR Negative 10/23/2016 0836   NITRITE Negative 10/23/2016 0836   LEUKOCYTESUR Negative 10/23/2016 0836     Assessment & Plan:    1. BPH -continue finasteride and flomax -follow up in one year  2. Gross hematuria Negative work up except for hypervascular prostate in August 2017. No further intervention at this time.  3. Prostate cancer screening Up to date. Will check PSA/DRE at next visit.  Return in about 1 year (around 01/23/2018).  Hildred LaserBrian James Lianni Kanaan, MD  [facility] [address], [address] [place], [place] [address] [phone]

## 2017-08-05 DIAGNOSIS — N401 Enlarged prostate with lower urinary tract symptoms: Secondary | ICD-10-CM | POA: Diagnosis not present

## 2017-08-05 DIAGNOSIS — R310 Gross hematuria: Secondary | ICD-10-CM | POA: Diagnosis not present

## 2017-08-05 DIAGNOSIS — M5489 Other dorsalgia: Secondary | ICD-10-CM | POA: Diagnosis not present

## 2017-08-07 ENCOUNTER — Other Ambulatory Visit: Payer: Self-pay | Admitting: Orthopedic Surgery

## 2017-08-07 DIAGNOSIS — R310 Gross hematuria: Secondary | ICD-10-CM

## 2017-08-14 ENCOUNTER — Ambulatory Visit
Admission: RE | Admit: 2017-08-14 | Discharge: 2017-08-14 | Disposition: A | Discharge status: Home / Self Care | Payer: 59 | Source: Ambulatory Visit | Attending: Orthopedic Surgery | Admitting: Orthopedic Surgery

## 2017-08-14 DIAGNOSIS — R310 Gross hematuria: Secondary | ICD-10-CM | POA: Insufficient documentation

## 2017-08-14 MED ORDER — IOPAMIDOL (ISOVUE-300) INJECTION 61%
125.0000 mL | Freq: Once | INTRAVENOUS | Status: AC | PRN
  Administered 2017-08-14: 125 mL via INTRAVENOUS

## 2017-08-24 DIAGNOSIS — M5489 Other dorsalgia: Secondary | ICD-10-CM | POA: Diagnosis not present

## 2017-08-24 DIAGNOSIS — R310 Gross hematuria: Secondary | ICD-10-CM | POA: Diagnosis not present

## 2017-08-24 DIAGNOSIS — N401 Enlarged prostate with lower urinary tract symptoms: Secondary | ICD-10-CM | POA: Diagnosis not present

## 2017-09-29 DIAGNOSIS — R944 Abnormal results of kidney function studies: Secondary | ICD-10-CM | POA: Diagnosis not present

## 2017-10-05 DIAGNOSIS — R944 Abnormal results of kidney function studies: Secondary | ICD-10-CM | POA: Diagnosis not present

## 2017-10-15 ENCOUNTER — Other Ambulatory Visit
Admission: RE | Admit: 2017-10-15 | Discharge: 2017-10-15 | Disposition: A | Discharge status: Home / Self Care | Payer: 59 | Source: Ambulatory Visit | Attending: Nephrology | Admitting: Nephrology

## 2017-10-15 DIAGNOSIS — R3129 Other microscopic hematuria: Secondary | ICD-10-CM | POA: Insufficient documentation

## 2017-10-15 LAB — COMPREHENSIVE METABOLIC PANEL
ALK PHOS: 79 U/L (ref 38–126)
ALT: 39 U/L (ref 17–63)
AST: 36 U/L (ref 15–41)
Albumin: 4.3 g/dL (ref 3.5–5.0)
Anion gap: 9 (ref 5–15)
BILIRUBIN TOTAL: 1.7 mg/dL — AB (ref 0.3–1.2)
BUN: 12 mg/dL (ref 6–20)
CALCIUM: 9.1 mg/dL (ref 8.9–10.3)
CO2: 27 mmol/L (ref 22–32)
CREATININE: 0.94 mg/dL (ref 0.61–1.24)
Chloride: 104 mmol/L (ref 101–111)
GFR calc non Af Amer: >60 mL/min (ref >60)
GLUCOSE: 93 mg/dL (ref 65–99)
Potassium: 3.9 mmol/L (ref 3.5–5.1)
SODIUM: 140 mmol/L (ref 135–145)
TOTAL PROTEIN: 7.5 g/dL (ref 6.5–8.1)

## 2017-10-15 LAB — CBC WITH DIFFERENTIAL/PLATELET
BASOS ABS: 0 10*3/uL (ref 0–0.1)
BASOS PCT: 1 %
EOS ABS: 0.1 10*3/uL (ref 0–0.7)
EOS PCT: 1 %
HCT: 43.3 % (ref 40.0–52.0)
Hemoglobin: 14.6 g/dL (ref 13.0–18.0)
Lymphocytes Relative: 27 %
Lymphs Abs: 1.4 10*3/uL (ref 1.0–3.6)
MCH: 32.2 pg (ref 26.0–34.0)
MCHC: 33.7 g/dL (ref 32.0–36.0)
MCV: 95.4 fL (ref 80.0–100.0)
MONO ABS: 0.5 10*3/uL (ref 0.2–1.0)
Monocytes Relative: 10 %
Neutro Abs: 3.2 10*3/uL (ref 1.4–6.5)
Neutrophils Relative %: 61 %
PLATELETS: 144 10*3/uL — AB (ref 150–440)
RBC: 4.54 MIL/uL (ref 4.40–5.90)
RDW: 13 % (ref 11.5–14.5)
WBC: 5.2 10*3/uL (ref 3.8–10.6)

## 2017-10-15 LAB — PROTEIN / CREATININE RATIO, URINE
Creatinine, Urine: 224 mg/dL
Protein Creatinine Ratio: 0.04 mg/mg{Cre} (ref 0.00–0.15)
Total Protein, Urine: 9 mg/dL

## 2017-10-15 LAB — URINALYSIS, COMPLETE (UACMP) WITH MICROSCOPIC
BACTERIA UA: NONE SEEN
BILIRUBIN URINE: NEGATIVE
Glucose, UA: NEGATIVE mg/dL
Hgb urine dipstick: NEGATIVE
KETONES UR: NEGATIVE mg/dL
LEUKOCYTES UA: NEGATIVE
Nitrite: NEGATIVE
PROTEIN: 30 mg/dL — AB
Specific Gravity, Urine: 1.02 (ref 1.005–1.030)
WBC, UA: NONE SEEN WBC/hpf (ref 0–5)
pH: 7 (ref 5.0–8.0)

## 2017-10-15 LAB — PROTIME-INR
INR: 1.06
PROTHROMBIN TIME: 13.7 s (ref 11.4–15.2)

## 2017-10-16 ENCOUNTER — Observation Stay: Payer: 59

## 2017-10-16 ENCOUNTER — Observation Stay
Admission: AD | Admit: 2017-10-16 | Discharge: 2017-10-17 | Disposition: A | Discharge status: Home / Self Care | Payer: 59 | Source: Ambulatory Visit | Attending: Nephrology | Admitting: Nephrology

## 2017-10-16 ENCOUNTER — Observation Stay: Admission: RE | Admit: 2017-10-16 | Payer: 59 | Source: Ambulatory Visit | Admitting: Nephrology

## 2017-10-16 DIAGNOSIS — R3129 Other microscopic hematuria: Principal | ICD-10-CM | POA: Insufficient documentation

## 2017-10-16 DIAGNOSIS — R319 Hematuria, unspecified: Secondary | ICD-10-CM | POA: Diagnosis not present

## 2017-10-16 LAB — CBC
HCT: 44.5 % (ref 40.0–52.0)
Hemoglobin: 15.3 g/dL (ref 13.0–18.0)
MCH: 32.7 pg (ref 26.0–34.0)
MCHC: 34.4 g/dL (ref 32.0–36.0)
MCV: 95.1 fL (ref 80.0–100.0)
PLATELETS: 139 10*3/uL — AB (ref 150–440)
RBC: 4.68 MIL/uL (ref 4.40–5.90)
RDW: 13.4 % (ref 11.5–14.5)
WBC: 6.3 10*3/uL (ref 3.8–10.6)

## 2017-10-16 LAB — TYPE AND SCREEN
ABO/RH(D): O POS
ABO/RH(D): O POS
ANTIBODY SCREEN: NEGATIVE
ANTIBODY SCREEN: NEGATIVE

## 2017-10-16 MED ORDER — OXYCODONE-ACETAMINOPHEN 5-325 MG PO TABS: 1.0000 | ORAL_TABLET | ORAL | Status: DC | PRN

## 2017-10-16 MED ORDER — ACETAMINOPHEN-CODEINE #3 300-30 MG PO TABS
1.0000 | ORAL_TABLET | ORAL | Status: DC | PRN
  Filled 2017-10-16: qty 2

## 2017-10-16 MED ORDER — SODIUM CHLORIDE 0.9 % IV SOLN
INTRAVENOUS | Status: DC
  Administered 2017-10-16: 09:00:00 via INTRAVENOUS

## 2017-10-16 MED ORDER — ACETAMINOPHEN 500 MG PO TABS
500.0000 mg | ORAL_TABLET | Freq: Four times a day (QID) | ORAL | Status: DC | PRN
  Administered 2017-10-17 (×2): 500 mg via ORAL
  Filled 2017-10-16 (×2): qty 1

## 2017-10-16 NOTE — Procedures (Signed)
After obtaining informed consent, the patient was brought down to the ultrasound suite. Subsequently the left kidney was identified under ultrasound. The left flank was prepped and draped in standard sterile fashion. Local anesthesia was achieved using 1% lidocaine. Subsequently using an 18-gauge biopsy device and ultraound guidance, a total of 3 passes were made into the left kidney. The specimens were submitted to pathology and deemed to be adequate for submission. No active bleeding noted on post-biopsy images.   The patient tolerated the procedure very well. He will return to his room for continued observation.   Estimated blood loss: Minimal  Small perinephric hematoma without active bleeding noted on post biopsy images.

## 2017-10-17 DIAGNOSIS — R3129 Other microscopic hematuria: Secondary | ICD-10-CM | POA: Diagnosis not present

## 2017-10-17 LAB — CBC
HEMATOCRIT: 44.5 % (ref 40.0–52.0)
HEMOGLOBIN: 15.4 g/dL (ref 13.0–18.0)
MCH: 33.1 pg (ref 26.0–34.0)
MCHC: 34.6 g/dL (ref 32.0–36.0)
MCV: 95.5 fL (ref 80.0–100.0)
Platelets: 140 10*3/uL — ABNORMAL LOW (ref 150–440)
RBC: 4.66 MIL/uL (ref 4.40–5.90)
RDW: 13.4 % (ref 11.5–14.5)
WBC: 5.8 10*3/uL (ref 3.8–10.6)

## 2017-10-17 NOTE — Discharge Summary (Addendum)
Date of admission: 10/16/2017. Date of discharge: 10/17/2017.  Admitting diagnosis: 1. Dysmorphic hematuria.  Discharge diagnoses: 1. Dysmorphic hematuria.  Procedures performed: 1. Percutaneous ultrasound guided left renal biopsy.  Hospital course:  Patient came into the hospital on 10/16/2017 for elective percutaneous ultrasound guided left renal biopsy. The procedure went well. He did have a very small left perinephric hematoma on post biopsy images without active bleeding. No hematuria after procedure. Patient tolerated procedure very well. On 10/17/2017 patient was doing well and felt to be stable for discharge.  He will follow-up in the office as previously scheduled to go over results of the renal biopsy.  Allergies as of 10/17/2017      Reactions   Etodolac Rash, Shortness Of Breath      Medication List    STOP taking these medications   azithromycin 250 MG tablet Commonly known as:  ZITHROMAX     TAKE these medications   finasteride 5 MG tablet Commonly known as:  PROSCAR Take 1 tablet (5 mg total) by mouth daily.   fluticasone 50 MCG/ACT nasal spray Commonly known as:  FLONASE Place into the nose.   tamsulosin 0.4 MG Caps capsule Commonly known as:  FLOMAX Take 1 capsule (0.4 mg total) by mouth daily.       Condition on discharge: Stable.  Restrictions: No heavy lifting greater than 5 pounds for 2 weeks.

## 2017-10-17 NOTE — Discharge Instructions (Signed)
Percutaneous Kidney Biopsy °A kidney biopsy is a procedure to remove small pieces of tissue from a kidney. In a percutaneous biopsy, the tissue is removed using a needle that is inserted through the skin. This procedure is done so that the tissue can be examined under a microscope and checked for disease or infection. °Tell a health care provider about: °· Any allergies you have. °· All medicines you are taking, including vitamins, herbs, eye drops, creams, and over-the-counter medicines. °· Any problems you or family members have had with anesthetic medicines. °· Any blood disorders you have. °· Any surgeries you have had. °· Any medical conditions you have. °· Whether you are pregnant or may be pregnant. °What are the risks? °Generally, this is a safe procedure. However, problems may occur, including: °· Infection. °· Bleeding. °· Allergic reactions to medicines. °· Damage to other structures or organs. °· Swelling from a collection of clotted blood outside a blood vessel (hematoma). °· Blood in the urine (hematuria). ° °What happens before the procedure? °· Follow instructions from your health care provider about eating or drinking restrictions. °· Ask your health care provider about: °? Changing or stopping your regular medicines. This is especially important if you are taking diabetes medicines or blood thinners. °? Taking medicines such as aspirin and ibuprofen. These medicines can thin your blood. Do not take these medicines before your procedure if your health care provider instructs you not to. °· You may be given antibiotic medicine to help prevent infection. °· You will have blood and urine samples taken. This is to make sure that you do not have a condition where you should not have a biopsy. °· Plan to have someone take you home from the hospital or clinic. °· Ask your health care provider how your biopsy site will be marked or identified. °What happens during the procedure? °· To lower your risk of  infection: °? Your health care team will wash or sanitize their hands. °? Your skin will be washed with soap. °· An IV tube will be inserted into one of your veins. °· You will be given one or more of the following: °? A medicine to help you relax (sedative). °? A medicine to numb the area (local anesthetic). °· You will lie on your abdomen. A firm pillow will be placed under your body to help push the kidneys closer to the surface of the skin. If you have a transplanted kidney, you will lie on your back. °· The health care provider will mark the area where the needle will enter your skin. °· An imaging test--such as an ultrasound, X-ray, CT scan, or MRI--will be used to locate the kidney. These images will also help the health care provider to guide the biopsy needle into the kidney. °· You will be asked to hold your breath and stay still while the health care provider inserts the needle and removes the kidney tissue. °? You will need to hold your breath and stay still for 30-45 seconds. °? During the biopsy, you may hear a popping sound from the needle. °? You may also feel some pressure from the area where the needle is being inserted. °· The needle may be inserted and removed 3 or 4 times to make sure that enough tissue is taken for testing. °· A bandage (dressing) may be placed over the spot where the needle entered your skin (biopsy site). °The procedure may vary among health care providers and hospitals. °What happens after the procedure? °·   Your blood pressure, heart rate, breathing rate, and blood oxygen level will be monitored until the medicines you were given have worn off. °· You will need to lie on your back for 6-8 hours. °· You may have some pain or soreness near the biopsy site. °· You may have pink or cloudy urine from small amounts of blood. This is normal. °· You may have grogginess or fatigue if you were given a sedative. °· Do not drive for 24 hours if you were given a sedative. °· It is up to  you to get the results of your procedure. Ask your health care provider, or the department performing the procedure, when your results will be ready. °This information is not intended to replace advice given to you by your health care provider. Make sure you discuss any questions you have with your health care provider. °Document Released: 10/11/2004 Document Revised: 09/12/2016 Document Reviewed: 09/12/2016 °Elsevier Interactive Patient Education © 2018 [employer] ° °

## 2017-10-17 NOTE — Progress Notes (Signed)
MD ordered patient to be discharged home.  Discharge instructions were reviewed with the patient and he voiced understanding.    No prescriptions given to the patient.  IV was removed with catheter intact.  All patients questions were answered.  Patient drove himself here, so I walked him to the medical mall door then he said he was good to walk to his car.

## 2017-10-26 DIAGNOSIS — R3129 Other microscopic hematuria: Secondary | ICD-10-CM | POA: Diagnosis not present

## 2017-10-27 LAB — SURGICAL PATHOLOGY

## 2017-10-28 ENCOUNTER — Encounter: Payer: Self-pay | Admitting: Nephrology

## 2017-11-11 ENCOUNTER — Encounter: Payer: Self-pay | Admitting: Nephrology

## 2018-01-28 ENCOUNTER — Ambulatory Visit: Payer: 59

## 2018-05-27 DIAGNOSIS — M25561 Pain in right knee: Secondary | ICD-10-CM | POA: Diagnosis not present

## 2018-05-27 DIAGNOSIS — M25461 Effusion, right knee: Secondary | ICD-10-CM | POA: Diagnosis not present

## 2018-06-15 IMAGING — US US BIOPSY
1 series · 14 of 21 positions shown · non-contrast
Comparison: CT abdomen 08/14/2017

CLINICAL DATA: Hematuria.

EXAM:
ULTRASOUND GUIDED CORE NEEDLE BIOPSY OF THE LEFT KIDNEY

[Series 1: us biopsy · 0.24mm/px · 14 of 21 slices shown]
[im 1/21]
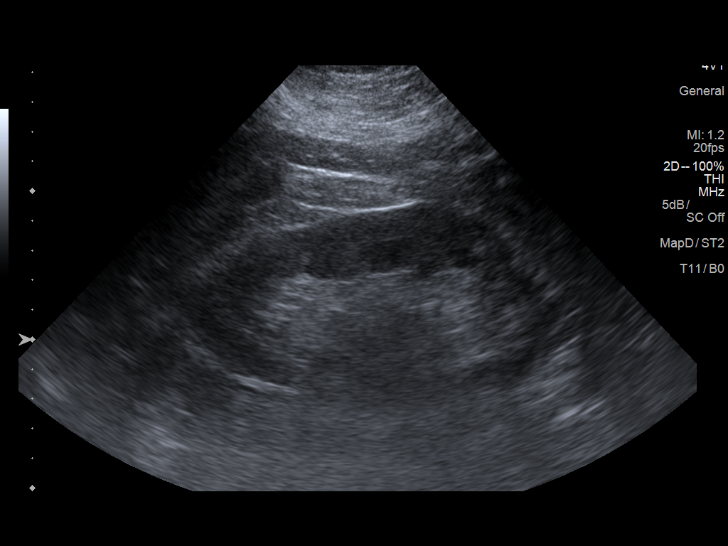
[im 3/21]
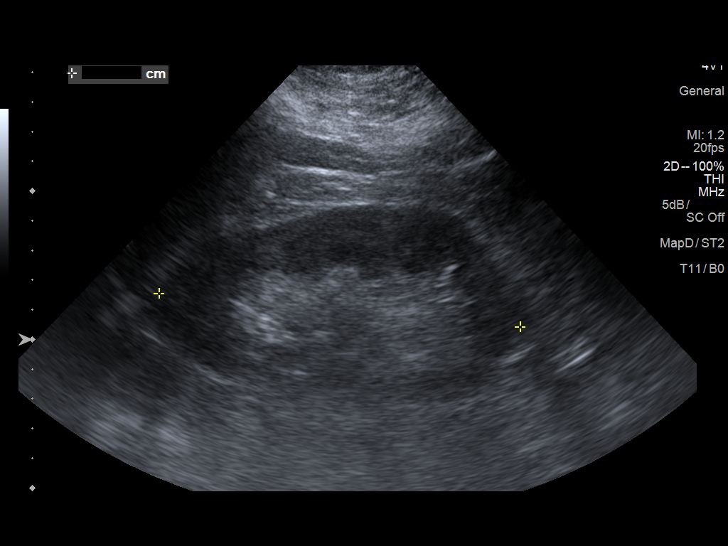
[im 4/21]
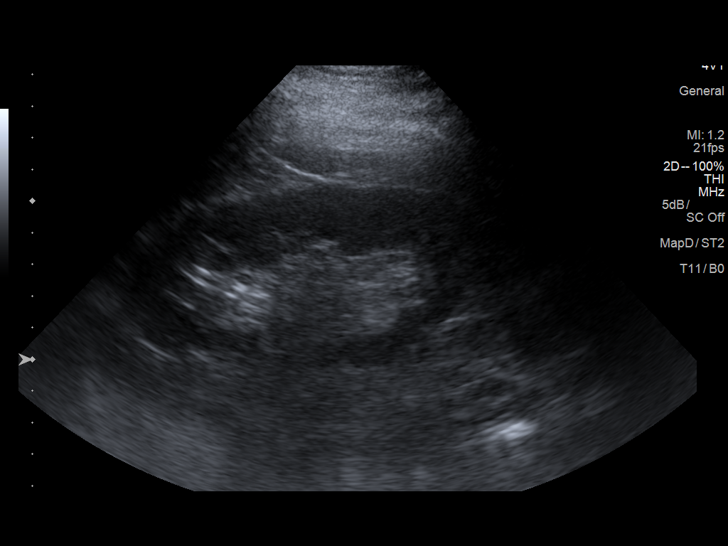
[im 6/21]
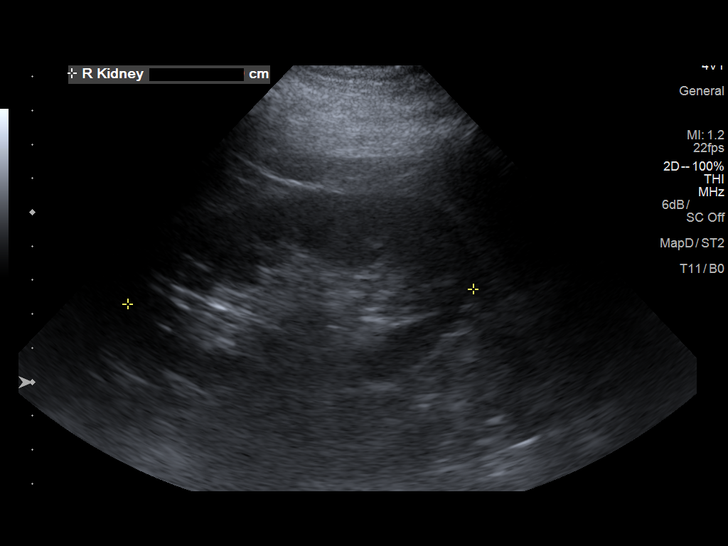
[im 7/21]
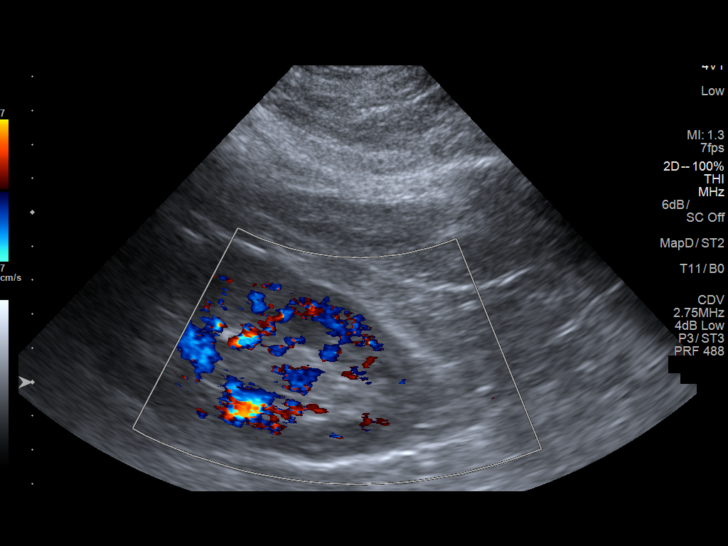
[im 9/21]
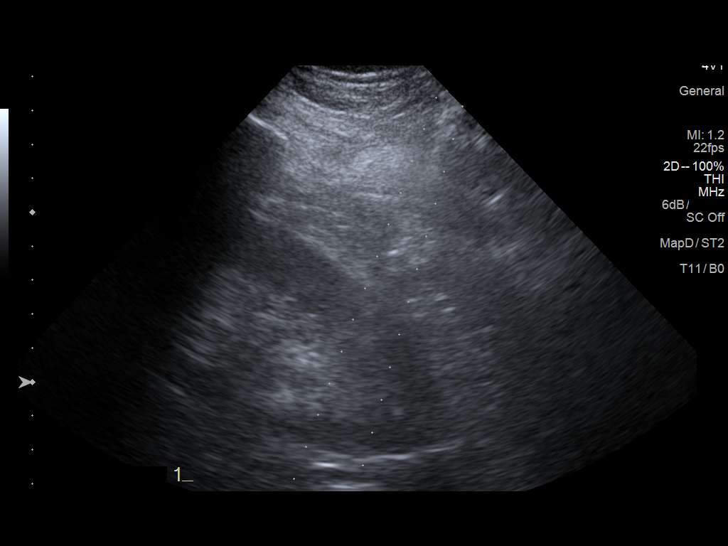
[im 10/21]
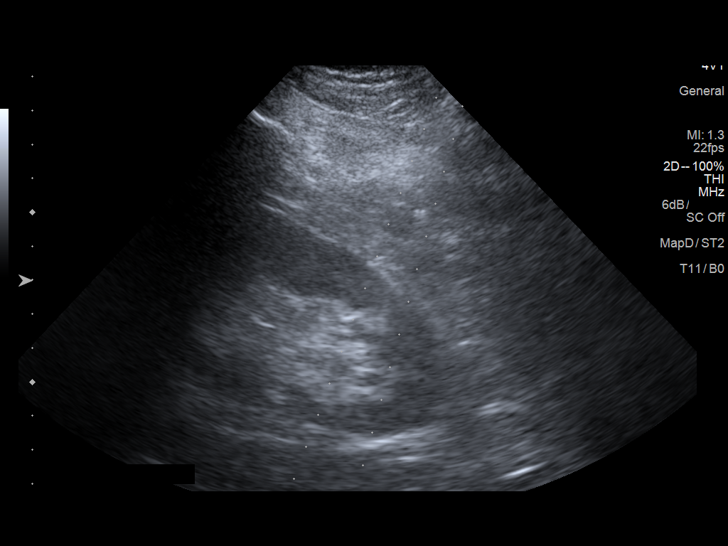
[im 12/21]
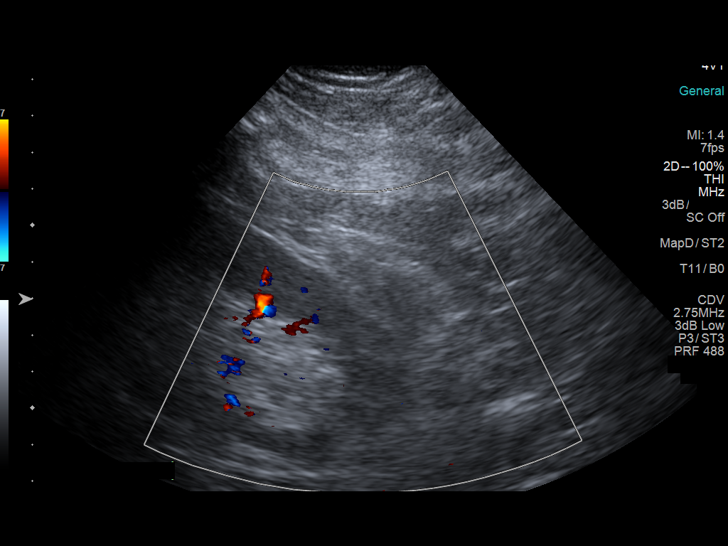
[im 13/21]
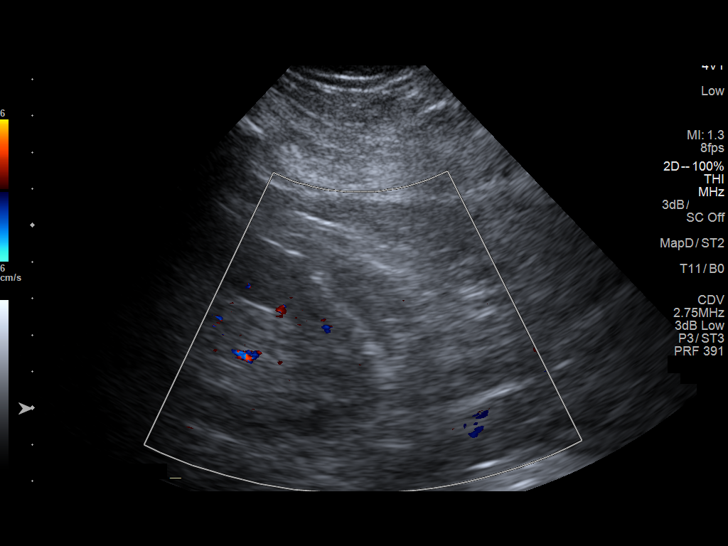
[im 15/21]
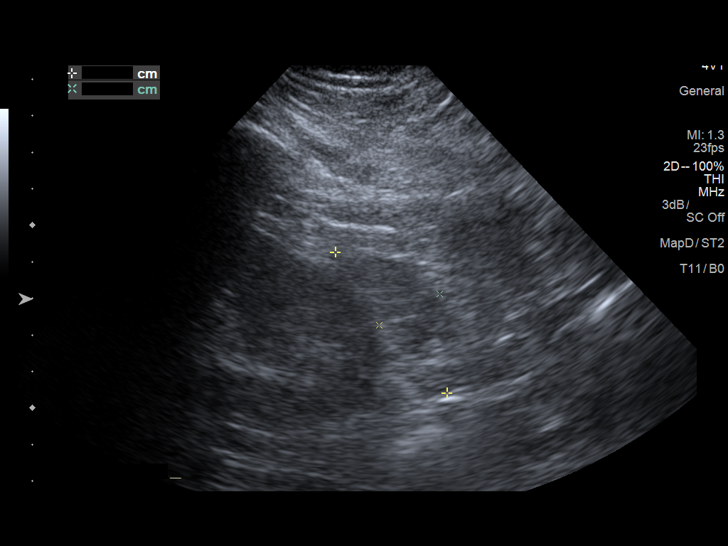
[im 16/21]
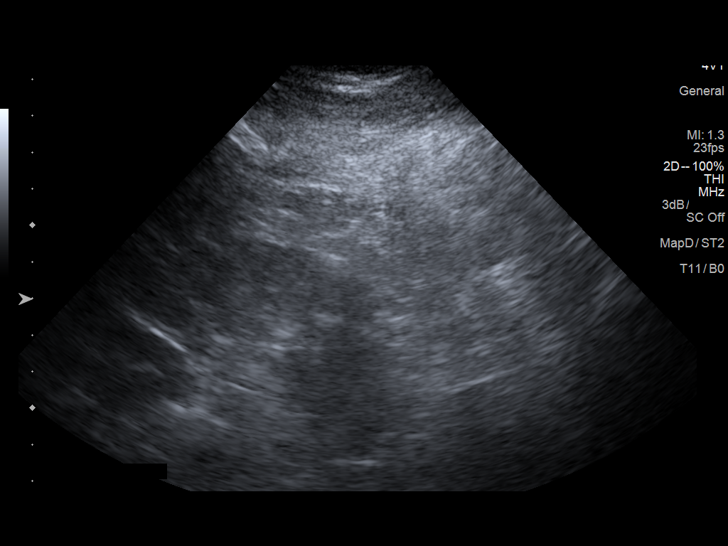
[im 18/21]
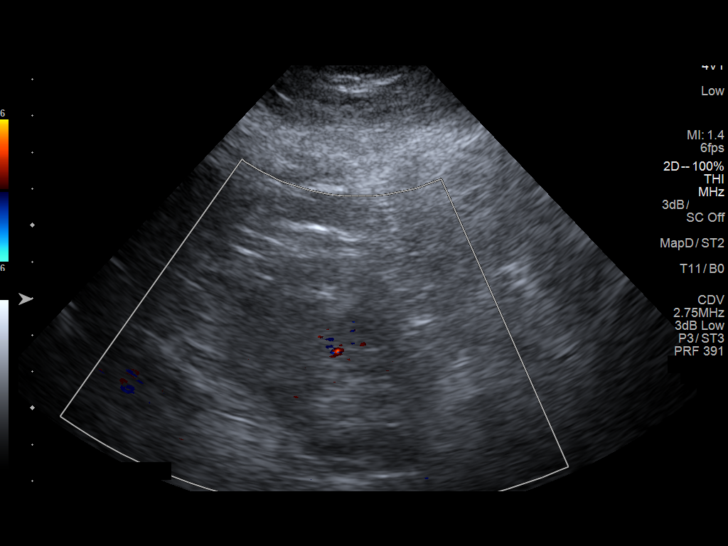
[im 19/21]
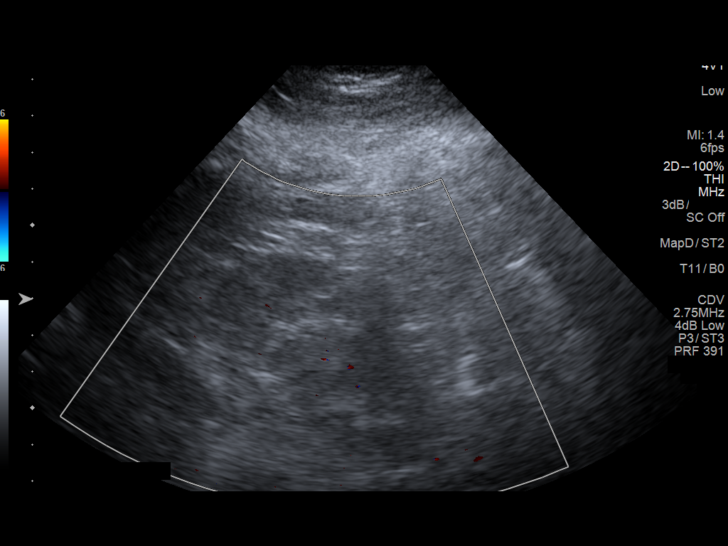
[im 21/21]
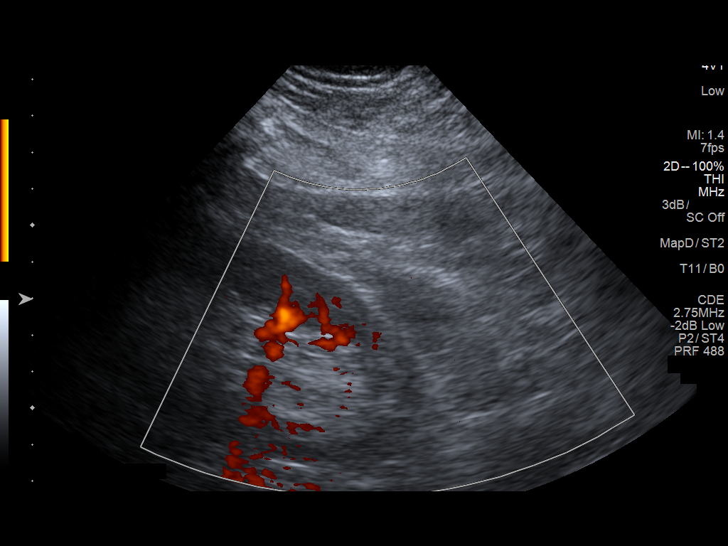

[14 of 21 positions shown; findings below may reference images not displayed]

FINDINGS: Ultrasound-guided left renal core biopsy performed by Dr. Gleb
without a radiologist present. Ultrasound-guided images are provided
for evaluation.

Normal renal length measuring 12.2 cm. No obstructive uropathy. No
renal mass.

Three core biopsies were performed of the inferior pole of the left
kidney. Left perinephric hematoma on the postprocedural images
measuring 4.9 x 1.9 cm.
IMPRESSION: Ultrasound guided biopsy of the left kidney without a radiologist
present.

Perinephric hematoma measuring 4.9 x 1.9 cm.

## 2018-06-16 DIAGNOSIS — M66822 Spontaneous rupture of other tendons, left upper arm: Secondary | ICD-10-CM | POA: Diagnosis not present

## 2018-06-21 DIAGNOSIS — M66822 Spontaneous rupture of other tendons, left upper arm: Secondary | ICD-10-CM | POA: Diagnosis not present

## 2018-11-16 DIAGNOSIS — M25461 Effusion, right knee: Secondary | ICD-10-CM | POA: Diagnosis not present

## 2018-11-16 DIAGNOSIS — M25561 Pain in right knee: Secondary | ICD-10-CM | POA: Diagnosis not present

## 2018-11-22 DIAGNOSIS — M25461 Effusion, right knee: Secondary | ICD-10-CM | POA: Diagnosis not present

## 2018-11-22 DIAGNOSIS — M25561 Pain in right knee: Secondary | ICD-10-CM | POA: Diagnosis not present

## 2018-12-02 DIAGNOSIS — S83231A Complex tear of medial meniscus, current injury, right knee, initial encounter: Secondary | ICD-10-CM | POA: Diagnosis not present

## 2018-12-03 DIAGNOSIS — S83231A Complex tear of medial meniscus, current injury, right knee, initial encounter: Secondary | ICD-10-CM | POA: Diagnosis not present

## 2018-12-07 ENCOUNTER — Other Ambulatory Visit: Payer: Self-pay | Admitting: Orthopedic Surgery

## 2018-12-10 ENCOUNTER — Encounter
Admission: RE | Admit: 2018-12-10 | Discharge: 2018-12-10 | Disposition: A | Discharge status: Home / Self Care | Payer: 59 | Source: Ambulatory Visit | Attending: Orthopedic Surgery | Admitting: Orthopedic Surgery

## 2018-12-10 ENCOUNTER — Other Ambulatory Visit: Payer: Self-pay

## 2018-12-10 HISTORY — DX: Anxiety disorder, unspecified: F41.9

## 2018-12-10 HISTORY — DX: Unspecified osteoarthritis, unspecified site: M19.90

## 2018-12-10 NOTE — Patient Instructions (Signed)
Your procedure is scheduled on: 12-14-18 TUESDAY Report to Same Day Surgery 2nd floor medical mall [facility](Medical Mall Entrance-take elevator on left to 2nd floor.  Check in with surgery information desk.) To find out your arrival time please call [phone] between 1PM - 3PM on 12-13-18 MONDAY  Remember: Instructions that are not followed completely may result in serious medical risk, up to and including death, or upon the discretion of your surgeon and anesthesiologist your surgery may need to be rescheduled.    _x___ 1. Do not eat food after midnight the night before your procedure. NO GUM OR CANDY AFTER MIDNIGHT.  You may drink clear liquids up to 2 hours before you are scheduled to arrive at the hospital for your procedure.  Do not drink clear liquids within 2 hours of your scheduled arrival to the hospital.  Clear liquids include  --Water or Apple juice without pulp  --Clear carbohydrate beverage such as ClearFast or Gatorade  --Black Coffee or Clear Tea (No milk, no creamers, do not add anything to the coffee or Tea   ____Ensure clear carbohydrate drink on the way to the hospital for bariatric patients  ____Ensure clear carbohydrate drink 3 hours before surgery for Dr Rutherford NailByrnett's patients if physician instructed.    __x__ 2. No Alcohol for 24 hours before or after surgery.   __x__3. No Smoking or e-cigarettes for 24 prior to surgery.  Do not use any chewable tobacco products for at least 6 hour prior to surgery   ____  4. Bring all medications with you on the day of surgery if instructed.    __x__ 5. Notify your doctor if there is any change in your medical condition     (cold, fever, infections).    x___6. On the morning of surgery brush your teeth with toothpaste and water.  You may rinse your mouth with mouth wash if you wish.  Do not swallow any toothpaste or mouthwash.   Do not wear jewelry, make-up, hairpins, clips or nail polish.  Do not wear lotions, powders, or perfumes.  You may wear deodorant.  Do not shave 48 hours prior to surgery. Men may shave face and neck.  Do not bring valuables to the hospital.    [facility] not responsible for any belongings or valuables.               Contacts, dentures or bridgework may not be worn into surgery.  Leave your suitcase in the car. After surgery it may be brought to your room.  For patients admitted to the hospital, discharge time is determined by your treatment team.  _  Patients discharged the day of surgery will not be allowed to drive home.  You will need someone to drive you home and stay with you the night of your procedure.    Please read over the following fact sheets that you were given:   [facility]   ____ Take anti-hypertensive listed below, cardiac, seizure, asthma, anti-reflux and psychiatric medicines. These include:  1.NONE   2.  3.  4.  5.  6.  ____Fleets enema or Magnesium Citrate as directed.   _x___ Use CHG Soap or sage wipes as directed on instruction sheet   ____ Use inhalers on the day of surgery and bring to hospital day of surgery  ____ Stop Metformin and Janumet 2 days prior to surgery.    ____ Take 1/2 of usual insulin dose the night before surgery and none on  the morning surgery.   ____ Follow recommendations from Cardiologist, Pulmonologist or PCP regarding stopping Aspirin, Coumadin, Plavix ,Eliquis, Effient, or Pradaxa, and Pletal.  X____Stop Anti-inflammatories such as Advil, Aleve, Ibuprofen, Motrin, Naproxen, Naprosyn, Goodies powders or aspirin products NOW-OK to take Tylenol    ____ Stop supplements until after surgery.   ____ Bring C-Pap to the hospital.

## 2018-12-13 ENCOUNTER — Encounter
Admission: RE | Admit: 2018-12-13 | Discharge: 2018-12-13 | Disposition: A | Discharge status: Home / Self Care | Payer: 59 | Source: Ambulatory Visit | Attending: Orthopedic Surgery | Admitting: Orthopedic Surgery

## 2018-12-13 DIAGNOSIS — Z01812 Encounter for preprocedural laboratory examination: Secondary | ICD-10-CM | POA: Insufficient documentation

## 2018-12-13 DIAGNOSIS — M659 Synovitis and tenosynovitis, unspecified: Secondary | ICD-10-CM | POA: Diagnosis not present

## 2018-12-13 DIAGNOSIS — F419 Anxiety disorder, unspecified: Secondary | ICD-10-CM | POA: Diagnosis not present

## 2018-12-13 DIAGNOSIS — Z888 Allergy status to other drugs, medicaments and biological substances status: Secondary | ICD-10-CM | POA: Diagnosis not present

## 2018-12-13 DIAGNOSIS — M1990 Unspecified osteoarthritis, unspecified site: Secondary | ICD-10-CM | POA: Diagnosis not present

## 2018-12-13 DIAGNOSIS — M6751 Plica syndrome, right knee: Secondary | ICD-10-CM | POA: Diagnosis not present

## 2018-12-13 DIAGNOSIS — S83231A Complex tear of medial meniscus, current injury, right knee, initial encounter: Secondary | ICD-10-CM | POA: Diagnosis not present

## 2018-12-13 DIAGNOSIS — S83241A Other tear of medial meniscus, current injury, right knee, initial encounter: Secondary | ICD-10-CM | POA: Diagnosis present

## 2018-12-13 DIAGNOSIS — Z79899 Other long term (current) drug therapy: Secondary | ICD-10-CM | POA: Diagnosis not present

## 2018-12-13 DIAGNOSIS — M94261 Chondromalacia, right knee: Secondary | ICD-10-CM | POA: Diagnosis not present

## 2018-12-13 DIAGNOSIS — X58XXXA Exposure to other specified factors, initial encounter: Secondary | ICD-10-CM | POA: Diagnosis not present

## 2018-12-13 DIAGNOSIS — M549 Dorsalgia, unspecified: Secondary | ICD-10-CM | POA: Diagnosis not present

## 2018-12-13 LAB — BASIC METABOLIC PANEL
ANION GAP: 9 (ref 5–15)
BUN: 13 mg/dL (ref 6–20)
CALCIUM: 9.4 mg/dL (ref 8.9–10.3)
CO2: 23 mmol/L (ref 22–32)
CREATININE: 0.98 mg/dL (ref 0.61–1.24)
Chloride: 108 mmol/L (ref 98–111)
GFR calc Af Amer: >60 mL/min (ref >60)
GLUCOSE: 97 mg/dL (ref 70–99)
Potassium: 4 mmol/L (ref 3.5–5.1)
Sodium: 140 mmol/L (ref 135–145)

## 2018-12-13 LAB — CBC WITH DIFFERENTIAL/PLATELET
Abs Immature Granulocytes: 0.04 10*3/uL (ref 0.00–0.07)
BASOS PCT: 1 %
Basophils Absolute: 0.1 10*3/uL (ref 0.0–0.1)
EOS ABS: 0.1 10*3/uL (ref 0.0–0.5)
EOS PCT: 1 %
HCT: 47.5 % (ref 39.0–52.0)
Hemoglobin: 15.8 g/dL (ref 13.0–17.0)
IMMATURE GRANULOCYTES: 1 %
LYMPHS ABS: 1.8 10*3/uL (ref 0.7–4.0)
Lymphocytes Relative: 25 %
MCH: 32.8 pg (ref 26.0–34.0)
MCHC: 33.3 g/dL (ref 30.0–36.0)
MCV: 98.5 fL (ref 80.0–100.0)
Monocytes Absolute: 0.7 10*3/uL (ref 0.1–1.0)
Monocytes Relative: 10 %
NEUTROS PCT: 62 %
Neutro Abs: 4.5 10*3/uL (ref 1.7–7.7)
PLATELETS: 173 10*3/uL (ref 150–400)
RBC: 4.82 MIL/uL (ref 4.22–5.81)
RDW: 12.1 % (ref 11.5–15.5)
WBC: 7.3 10*3/uL (ref 4.0–10.5)
nRBC: 0 % (ref 0.0–0.2)

## 2018-12-13 LAB — PROTIME-INR
INR: 1.01
Prothrombin Time: 13.2 seconds (ref 11.4–15.2)

## 2018-12-13 LAB — APTT: APTT: 29 s (ref 24–36)

## 2018-12-13 MED ORDER — CEFAZOLIN SODIUM-DEXTROSE 2-4 GM/100ML-% IV SOLN
2.0000 g | INTRAVENOUS | Status: AC
  Administered 2018-12-14: 2 g via INTRAVENOUS

## 2018-12-14 ENCOUNTER — Ambulatory Visit
Admission: RE | Admit: 2018-12-14 | Discharge: 2018-12-14 | Disposition: A | Discharge status: Home / Self Care | Payer: 59 | Attending: Orthopedic Surgery | Admitting: Orthopedic Surgery

## 2018-12-14 ENCOUNTER — Ambulatory Visit: Payer: 59 | Admitting: Certified Registered"

## 2018-12-14 ENCOUNTER — Encounter: Payer: Self-pay | Admitting: *Deleted

## 2018-12-14 ENCOUNTER — Encounter
Admission: RE | Disposition: A | Discharge status: Home / Self Care | Payer: Self-pay | Source: Home / Self Care | Attending: Orthopedic Surgery

## 2018-12-14 ENCOUNTER — Other Ambulatory Visit: Payer: Self-pay

## 2018-12-14 DIAGNOSIS — M659 Synovitis and tenosynovitis, unspecified: Secondary | ICD-10-CM | POA: Diagnosis not present

## 2018-12-14 DIAGNOSIS — Z888 Allergy status to other drugs, medicaments and biological substances status: Secondary | ICD-10-CM | POA: Insufficient documentation

## 2018-12-14 DIAGNOSIS — F419 Anxiety disorder, unspecified: Secondary | ICD-10-CM | POA: Insufficient documentation

## 2018-12-14 DIAGNOSIS — M94261 Chondromalacia, right knee: Secondary | ICD-10-CM | POA: Insufficient documentation

## 2018-12-14 DIAGNOSIS — S83241D Other tear of medial meniscus, current injury, right knee, subsequent encounter: Secondary | ICD-10-CM | POA: Diagnosis not present

## 2018-12-14 DIAGNOSIS — S83231A Complex tear of medial meniscus, current injury, right knee, initial encounter: Secondary | ICD-10-CM | POA: Insufficient documentation

## 2018-12-14 DIAGNOSIS — M549 Dorsalgia, unspecified: Secondary | ICD-10-CM | POA: Insufficient documentation

## 2018-12-14 DIAGNOSIS — X58XXXA Exposure to other specified factors, initial encounter: Secondary | ICD-10-CM | POA: Insufficient documentation

## 2018-12-14 DIAGNOSIS — S83241A Other tear of medial meniscus, current injury, right knee, initial encounter: Secondary | ICD-10-CM | POA: Diagnosis not present

## 2018-12-14 DIAGNOSIS — Z79899 Other long term (current) drug therapy: Secondary | ICD-10-CM | POA: Insufficient documentation

## 2018-12-14 DIAGNOSIS — M6751 Plica syndrome, right knee: Secondary | ICD-10-CM | POA: Insufficient documentation

## 2018-12-14 DIAGNOSIS — M1990 Unspecified osteoarthritis, unspecified site: Secondary | ICD-10-CM | POA: Insufficient documentation

## 2018-12-14 HISTORY — PX: KNEE ARTHROSCOPY: SHX127

## 2018-12-14 SURGERY — ARTHROSCOPY, KNEE
Anesthesia: General | Site: Knee | Laterality: Right

## 2018-12-14 MED ORDER — FENTANYL CITRATE (PF) 100 MCG/2ML IJ SOLN
INTRAMUSCULAR | Status: AC
  Administered 2018-12-14: 50 ug via INTRAVENOUS
  Filled 2018-12-14: qty 2

## 2018-12-14 MED ORDER — BUPIVACAINE-EPINEPHRINE (PF) 0.25% -1:200000 IJ SOLN
INTRAMUSCULAR | Status: AC
  Filled 2018-12-14: qty 30

## 2018-12-14 MED ORDER — FAMOTIDINE 20 MG PO TABS
ORAL_TABLET | ORAL | Status: AC
  Administered 2018-12-14: 20 mg via ORAL
  Filled 2018-12-14: qty 1

## 2018-12-14 MED ORDER — ONDANSETRON HCL 4 MG PO TABS: 4.0000 mg | ORAL_TABLET | Freq: Three times a day (TID) | ORAL | 0 refills | Status: DC | PRN

## 2018-12-14 MED ORDER — PROPOFOL 10 MG/ML IV BOLUS
INTRAVENOUS | Status: DC | PRN
  Administered 2018-12-14: 200 mg via INTRAVENOUS

## 2018-12-14 MED ORDER — FENTANYL CITRATE (PF) 100 MCG/2ML IJ SOLN
INTRAMUSCULAR | Status: DC | PRN
  Administered 2018-12-14: 25 ug via INTRAVENOUS
  Administered 2018-12-14: 50 ug via INTRAVENOUS

## 2018-12-14 MED ORDER — DEXAMETHASONE SODIUM PHOSPHATE 10 MG/ML IJ SOLN
INTRAMUSCULAR | Status: DC | PRN
  Administered 2018-12-14: 8 mg via INTRAVENOUS

## 2018-12-14 MED ORDER — CHLORHEXIDINE GLUCONATE CLOTH 2 % EX PADS: 6.0000 | MEDICATED_PAD | Freq: Once | CUTANEOUS | Status: DC

## 2018-12-14 MED ORDER — ASPIRIN EC 325 MG PO TBEC: 325.0000 mg | DELAYED_RELEASE_TABLET | Freq: Every day | ORAL | 0 refills | Status: DC

## 2018-12-14 MED ORDER — LACTATED RINGERS IV SOLN
INTRAVENOUS | Status: DC
  Administered 2018-12-14: 11:00:00 via INTRAVENOUS

## 2018-12-14 MED ORDER — ONDANSETRON HCL 4 MG/2ML IJ SOLN
INTRAMUSCULAR | Status: AC
  Filled 2018-12-14: qty 2

## 2018-12-14 MED ORDER — OXYCODONE HCL 5 MG PO TABS: 5.0000 mg | ORAL_TABLET | ORAL | 0 refills | Status: DC | PRN

## 2018-12-14 MED ORDER — PROMETHAZINE HCL 25 MG/ML IJ SOLN
6.2500 mg | INTRAMUSCULAR | Status: DC | PRN
  Administered 2018-12-14: 6.25 mg via INTRAVENOUS

## 2018-12-14 MED ORDER — PROMETHAZINE HCL 25 MG/ML IJ SOLN
INTRAMUSCULAR | Status: AC
  Administered 2018-12-14: 6.25 mg via INTRAVENOUS
  Filled 2018-12-14: qty 1

## 2018-12-14 MED ORDER — MIDAZOLAM HCL 2 MG/2ML IJ SOLN
INTRAMUSCULAR | Status: AC
  Filled 2018-12-14: qty 2

## 2018-12-14 MED ORDER — FENTANYL CITRATE (PF) 100 MCG/2ML IJ SOLN
25.0000 ug | INTRAMUSCULAR | Status: DC | PRN
  Administered 2018-12-14 (×2): 50 ug via INTRAVENOUS

## 2018-12-14 MED ORDER — ACETAMINOPHEN 10 MG/ML IV SOLN
INTRAVENOUS | Status: DC | PRN
  Administered 2018-12-14: 1000 mg via INTRAVENOUS

## 2018-12-14 MED ORDER — DEXAMETHASONE SODIUM PHOSPHATE 10 MG/ML IJ SOLN
INTRAMUSCULAR | Status: AC
  Filled 2018-12-14: qty 1

## 2018-12-14 MED ORDER — SODIUM CHLORIDE FLUSH 0.9 % IV SOLN
INTRAVENOUS | Status: AC
  Filled 2018-12-14: qty 10

## 2018-12-14 MED ORDER — LIDOCAINE HCL (PF) 2 % IJ SOLN
INTRAMUSCULAR | Status: AC
  Filled 2018-12-14: qty 10

## 2018-12-14 MED ORDER — BUPIVACAINE-EPINEPHRINE (PF) 0.25% -1:200000 IJ SOLN
INTRAMUSCULAR | Status: DC | PRN
  Administered 2018-12-14: 30 mL

## 2018-12-14 MED ORDER — PROPOFOL 10 MG/ML IV BOLUS
INTRAVENOUS | Status: AC
  Filled 2018-12-14: qty 20

## 2018-12-14 MED ORDER — LIDOCAINE HCL (PF) 1 % IJ SOLN
INTRAMUSCULAR | Status: DC | PRN
  Administered 2018-12-14: 5 mL

## 2018-12-14 MED ORDER — LIDOCAINE HCL (CARDIAC) PF 100 MG/5ML IV SOSY
PREFILLED_SYRINGE | INTRAVENOUS | Status: DC | PRN
  Administered 2018-12-14: 100 mg via INTRAVENOUS

## 2018-12-14 MED ORDER — FENTANYL CITRATE (PF) 250 MCG/5ML IJ SOLN
INTRAMUSCULAR | Status: AC
  Filled 2018-12-14: qty 5

## 2018-12-14 MED ORDER — MIDAZOLAM HCL 2 MG/2ML IJ SOLN
INTRAMUSCULAR | Status: DC | PRN
  Administered 2018-12-14: 2 mg via INTRAVENOUS

## 2018-12-14 MED ORDER — FAMOTIDINE 20 MG PO TABS
20.0000 mg | ORAL_TABLET | Freq: Once | ORAL | Status: AC
  Administered 2018-12-14: 20 mg via ORAL

## 2018-12-14 MED ORDER — ONDANSETRON HCL 4 MG/2ML IJ SOLN
INTRAMUSCULAR | Status: DC | PRN
  Administered 2018-12-14: 4 mg via INTRAVENOUS

## 2018-12-14 MED ORDER — LIDOCAINE HCL (PF) 1 % IJ SOLN
INTRAMUSCULAR | Status: AC
  Filled 2018-12-14: qty 30

## 2018-12-14 MED ORDER — CEFAZOLIN SODIUM-DEXTROSE 2-4 GM/100ML-% IV SOLN
INTRAVENOUS | Status: AC
  Filled 2018-12-14: qty 100

## 2018-12-14 SURGICAL SUPPLY — 41 items
ADAPTER IRRIG TUBE 2 SPIKE SOL (ADAPTER) ×4 IMPLANT
BUR RADIUS 3.5 (BURR) ×2 IMPLANT
BUR RADIUS 4.0X18.5 (BURR) ×2 IMPLANT
CANISTER SUCT LVC 12 LTR MEDI- (MISCELLANEOUS) IMPLANT
COOLER POLAR GLACIER W/PUMP (MISCELLANEOUS) IMPLANT
COVER WAND RF STERILE (DRAPES) IMPLANT
CUFF TOURN 24 STER (MISCELLANEOUS) IMPLANT
CUFF TOURN 30 STER DUAL PORT (MISCELLANEOUS) ×2 IMPLANT
DEVICE SUCT BLK HOLE OR FLOOR (MISCELLANEOUS) IMPLANT
DRAPE IMP U-DRAPE 54X76 (DRAPES) ×2 IMPLANT
DURAPREP 26ML APPLICATOR (WOUND CARE) ×6 IMPLANT
GAUZE PETRO XEROFOAM 1X8 (MISCELLANEOUS) ×2 IMPLANT
GAUZE SPONGE 4X4 12PLY STRL (GAUZE/BANDAGES/DRESSINGS) ×2 IMPLANT
GLOVE BIOGEL PI IND STRL 9 (GLOVE) ×1 IMPLANT
GLOVE BIOGEL PI INDICATOR 9 (GLOVE) ×1
GLOVE SURG 9.0 ORTHO LTXF (GLOVE) ×4 IMPLANT
GOWN STRL REUS TWL 2XL XL LVL4 (GOWN DISPOSABLE) ×2 IMPLANT
GOWN STRL REUS W/ TWL LRG LVL3 (GOWN DISPOSABLE) ×1 IMPLANT
GOWN STRL REUS W/TWL LRG LVL3 (GOWN DISPOSABLE) ×1
IV LACTATED RINGER IRRG 3000ML (IV SOLUTION) ×11
IV LR IRRIG 3000ML ARTHROMATIC (IV SOLUTION) ×11 IMPLANT
KIT TURNOVER KIT A (KITS) ×2 IMPLANT
MANIFOLD NEPTUNE II (INSTRUMENTS) ×2 IMPLANT
MAT ABSORB  FLUID 56X50 GRAY (MISCELLANEOUS) ×1
MAT ABSORB FLUID 56X50 GRAY (MISCELLANEOUS) ×1 IMPLANT
NEEDLE HYPO 22GX1.5 SAFETY (NEEDLE) ×2 IMPLANT
PACK ARTHROSCOPY KNEE (MISCELLANEOUS) ×2 IMPLANT
PAD ABD DERMACEA PRESS 5X9 (GAUZE/BANDAGES/DRESSINGS) ×4 IMPLANT
PAD WRAPON POLAR KNEE (MISCELLANEOUS) ×1 IMPLANT
SET TUBE SUCT SHAVER OUTFL 24K (TUBING) ×2 IMPLANT
SET TUBE TIP INTRA-ARTICULAR (MISCELLANEOUS) ×2 IMPLANT
SOL PREP PVP 2OZ (MISCELLANEOUS)
SOLUTION PREP PVP 2OZ (MISCELLANEOUS) IMPLANT
STRIP CLOSURE SKIN 1/2X4 (GAUZE/BANDAGES/DRESSINGS) ×2 IMPLANT
SUT ETHILON 4-0 (SUTURE) ×1
SUT ETHILON 4-0 FS2 18XMFL BLK (SUTURE) ×1
SUTURE ETHLN 4-0 FS2 18XMF BLK (SUTURE) ×1 IMPLANT
TUBING ARTHRO INFLOW-ONLY STRL (TUBING) ×2 IMPLANT
WAND HAND CNTRL MULTIVAC 50 (MISCELLANEOUS) IMPLANT
WAND HAND CNTRL MULTIVAC 90 (MISCELLANEOUS) ×2 IMPLANT
WRAPON POLAR PAD KNEE (MISCELLANEOUS) ×2

## 2018-12-14 NOTE — Anesthesia Post-op Follow-up Note (Signed)
Anesthesia QCDR form completed.        

## 2018-12-14 NOTE — H&P (Signed)
PREOPERATIVE H&P  Chief Complaint: TEAR OF MEDIAL MENISCUS OF RIGHT KNEE  HPI: Duane Crawford is a 57 y.o. male who presents for preoperative history and physical with a diagnosis of TEAR OF MEDIAL MENISCUS OF RIGHT KNEE. Symptom of pain is rated as moderate to severe, and have been worsening.  This is significantly impairing activities of daily living.  Patient has failed nonoperative treatment and has elected for surgical management.   Past Medical History:  Diagnosis Date  . Anxiety   . Arthritis   . Back pain   . Gross hematuria    Past Surgical History:  Procedure Laterality Date  . CARPAL TUNNEL RELEASE Right 2015  . CERVICAL SPINE SURGERY     Social History   Socioeconomic History  . Marital status: Widowed    Spouse name: Not on file  . Number of children: Not on file  . Years of education: Not on file  . Highest education level: Not on file  Occupational History  . Not on file  Social Needs  . Financial resource strain: Not on file  . Food insecurity:    Worry: Not on file    Inability: Not on file  . Transportation needs:    Medical: Not on file    Non-medical: Not on file  Tobacco Use  . Smoking status: Never Smoker  . Smokeless tobacco: Never Used  Substance and Sexual Activity  . Alcohol use: Yes    Comment: OCC BEER  . Drug use: No  . Sexual activity: Not on file  Lifestyle  . Physical activity:    Days per week: Not on file    Minutes per session: Not on file  . Stress: Not on file  Relationships  . Social connections:    Talks on phone: Not on file    Gets together: Not on file    Attends religious service: Not on file    Active member of club or organization: Not on file    Attends meetings of clubs or organizations: Not on file    Relationship status: Not on file  Other Topics Concern  . Not on file  Social History Narrative  . Not on file   Family History  Problem Relation Age of Onset  . Prostate cancer Neg Hx   . Kidney disease  Neg Hx    Allergies  Allergen Reactions  . Etodolac Rash and Shortness Of Breath   Prior to Admission medications   Medication Sig Start Date End Date Taking? Authorizing Provider  OVER THE COUNTER MEDICATION Take 1 drop by mouth daily. CBD OIL   Yes [provider]  finasteride (PROSCAR) 5 MG tablet Take 1 tablet (5 mg total) by mouth daily. Patient not taking: Reported on 12/06/2018 07/25/16   Hildred LaserBudzyn, Brian James, MD  tamsulosin (FLOMAX) 0.4 MG CAPS capsule Take 1 capsule (0.4 mg total) by mouth daily. Patient not taking: Reported on 12/06/2018 10/23/16   Hildred LaserBudzyn, Brian James, MD     Positive ROS: All other systems have been reviewed and were otherwise negative with the exception of those mentioned in the HPI and as above.  Physical Exam: General: Alert, no acute distress Cardiovascular: Regular rate and rhythm, no murmurs rubs or gallops.  No pedal edema Respiratory: Clear to auscultation bilaterally, no wheezes rales or rhonchi. No cyanosis, no use of accessory musculature GI: No organomegaly, abdomen is soft and non-tender nondistended with positive bowel sounds. Skin: Skin intact, no lesions within the operative field. Neurologic:  Sensation intact distally Psychiatric: Patient is competent for consent with normal mood and affect Lymphatic: No cervical lymphadenopathy  MUSCULOSKELETAL: Right knee: Patient has range of motion from 0 to 110 degrees of flexion.  He is a trace effusion without erythema or ecchymosis.  He has point tenderness along the medial joint line and pain with McMurray's testing.  He has no ligamentous laxity.  He is 5 out of 5 strength in all muscle groups, intact sensation to light touch and palpable pedal pulses.  Assessment: TEAR OF MEDIAL MENISCUS OF RIGHT KNEE  Plan: Plan for Procedure(s): RIGHT KNEE ARTHROSCOPY WITH PARTIAL MEDIAL MENISCECTOMY  Discussed the details of the operation as well as the postoperative course with the patient.  I  discussed the risks and benefits of surgery.  He understands the risks include but are not limited to infection, bleeding, nerve or blood vessel injury, joint stiffness or loss of motion, persistent pain, weakness or instability, progression of osteoarthritis and the need for further surgery. Medical risks include but are not limited to DVT and pulmonary embolism, myocardial infarction, stroke, pneumonia, respiratory failure and death. Patient understood these risks and wished to proceed.     Juanell FairlyKRASINSKI, Manila Rommel, MD   12/14/2018 11:39 AM

## 2018-12-14 NOTE — Anesthesia Postprocedure Evaluation (Signed)
Anesthesia Post Note  Patient: Duane Crawford  Procedure(s) Performed: ARTHROSCOPY KNEE WITH MEDIAL MENISCECTOMY (Right Knee)  Anesthesia Type: General     Last Vitals:  Vitals:   12/14/18 1000  BP: 126/88  Pulse: 69  Resp: 18  Temp: 36.6 C  SpO2: 98%    Last Pain:  Vitals:   12/14/18 1000  TempSrc: Tympanic  PainSc: 0-No pain                 Iran PlanasJason P Earlyn Sylvan

## 2018-12-14 NOTE — Op Note (Signed)
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 male with a diagnosis of TEAR OF MEDIAL MENISCUS who failed conservative management and elected for surgical management.    The risks benefits and alternatives were discussed with the patient preoperatively including the risks of infection, bleeding, nerve injury, knee stiffness, persistent pain, osteoarthritis and the need for further surgery. Medical  risks include DVT and pulmonary embolism, myocardial infarction, stroke, pneumonia, respiratory failure and death. The patient understood these risks and wished to proceed.  OPERATIVE PROCEDURE: Patient was met in the preoperative area. The operative extremity was signed with the word yes and my initials according the hospital's correct site of surgery protocol.  A preop history and physical was performed at the bedside.  The patient was brought to the operating room where they was placed supine on the operative table. General anesthesia was administered. The patient was prepped and draped in a sterile fashion.  A timeout was performed to verify the patient's name, date of birth, medical record number, correct site of surgery correct procedure to be performed. It was also used to verify the patient received antibiotics that all appropriate instruments, and radiographic studies were available in the room. Once all in attendance were in agreement, the case began.  Proposed arthroscopy incisions were drawn out with a surgical  marker. These were pre-injected with 1% lidocaine plain. An 11 blade was used to establish an inferior lateral and inferomedial portals. The inferomedial portal was created using a 18-gauge spinal needle under direct visualization.  A full diagnostic examination of the knee was performed including the suprapatellar pouch, patellofemoral joint, medial lateral compartments as well as the medial lateral gutters, the intercondylar notch in the posterior knee.  Patient had a complex degenerative tear involving the posterior horn of the medial meniscus with a radial component near the meniscal root.  The meniscus tear was treated with a 4-0 resector shaver blade and straight duckbill basket. The meniscus was debrided until a stable rim was achieved. A chondroplasty of the medial femoral condyle, trochlea and undersurface of patella was also performed using a 4-0 resector shaver blade.  Patient had abundant synovitis in the suprapatellar space as well as the medial and lateral gutters and anterior knee.  An extensive synovectomy was performed using a 4-0 resector shaver blade and 90 ArthroCare wand.  A 90 degree ArthroCare wand was also used to resect the suprapatellar plica and perform a limited lateral release given the patient had limited joint space of the patellofemoral joint, specially the lateral facet of the patella and trochlea.  The knee was then copiously lavaged. All arthroscopic instruments were removed. The 2 arthroscopy portals were closed with 4-0 nylon. Steri-Strips were applied along with a dry sterile and compressive dressing. The patient was brought to the PACU in stable condition. I was scrubbed and present for the entire case and all sharp and instrument counts were correct at the conclusion the case. I spoke with the patient's sons postoperatively to let them know the case was performed without complication and the patient was stable in the  recovery room.   Timoteo Gaul, MD

## 2018-12-14 NOTE — Anesthesia Preprocedure Evaluation (Signed)
Anesthesia Evaluation  Patient identified by MRN, date of birth, ID band Patient awake    Reviewed: Allergy & Precautions, H&P , NPO status , Patient's Chart, lab work & pertinent test results, reviewed documented beta blocker date and time   History of Anesthesia Complications Negative for: history of anesthetic complications  Airway Mallampati: III  TM Distance: >3 FB Neck ROM: full    Dental  (+) Dental Advidsory Given, Caps   Pulmonary neg pulmonary ROS,           Cardiovascular Exercise Tolerance: Good negative cardio ROS       Neuro/Psych PSYCHIATRIC DISORDERS Anxiety negative neurological ROS     GI/Hepatic negative GI ROS, Neg liver ROS,   Endo/Other  negative endocrine ROS  Renal/GU negative Renal ROS  negative genitourinary   Musculoskeletal   Abdominal   Peds  Hematology negative hematology ROS (+)   Anesthesia Other Findings Past Medical History: No date: Anxiety No date: Arthritis No date: Back pain No date: Gross hematuria   Reproductive/Obstetrics negative OB ROS                             Anesthesia Physical Anesthesia Plan  ASA: II  Anesthesia Plan: General   Post-op Pain Management:    Induction: Intravenous  PONV Risk Score and Plan: 2 and Ondansetron, Dexamethasone, Midazolam and Treatment may vary due to age or medical condition  Airway Management Planned: LMA  Additional Equipment:   Intra-op Plan:   Post-operative Plan: Extubation in OR  Informed Consent: I have reviewed the patients History and Physical, chart, labs and discussed the procedure including the risks, benefits and alternatives for the proposed anesthesia with the patient or authorized representative who has indicated his/her understanding and acceptance.   Dental Advisory Given  Plan Discussed with: Anesthesiologist, CRNA and Surgeon  Anesthesia Plan Comments:          Anesthesia Quick Evaluation

## 2018-12-14 NOTE — Anesthesia Procedure Notes (Signed)
Procedure Name: LMA Insertion Date/Time: 12/14/2018 11:51 AM Performed by: Iran PlanasHolmes, Vester Balthazor P, CRNA Pre-anesthesia Checklist: Patient identified, Patient being monitored, Timeout performed, Emergency Drugs available and Suction available Patient Re-evaluated:Patient Re-evaluated prior to induction Oxygen Delivery Method: Circle system utilized Preoxygenation: Pre-oxygenation with 100% oxygen Induction Type: IV induction Ventilation: Mask ventilation without difficulty LMA: LMA inserted LMA Size: 5.0 Tube type: Oral Number of attempts: 1 Placement Confirmation: positive ETCO2 and breath sounds checked- equal and bilateral Tube secured with: Tape Dental Injury: Teeth and Oropharynx as per pre-operative assessment

## 2018-12-14 NOTE — Discharge Instructions (Signed)
Knee Arthroscopy, Care After  This sheet gives you information about how to care for yourself after your procedure. Your health care provider may also give you more specific instructions. If you have problems or questions, contact your health care provider.  What can I expect after the procedure?  After the procedure, it is common to have:  · Soreness.  · Swelling.  · Pain that can be relieved by taking pain medicine.  Follow these instructions at home:  Incision care    · Follow instructions from your health care provider about how to take care of your incisions. Make sure you:  ? Wash your hands with soap and water before you change your bandage (dressing). If soap and water are not available, use hand sanitizer.  ? Change your dressing as told by your health care provider.  ? Leave stitches (sutures), staples, skin glue, or adhesive strips in place. These skin closures may need to stay in place for 2 weeks or longer. If adhesive strip edges start to loosen and curl up, you may trim the loose edges. Do not remove adhesive strips completely unless your health care provider tells you to do that.  · Check your incision areas every day for signs of infection. Check for:  ? Redness.  ? More swelling or pain.  ? Fluid or blood.  ? Warmth.  ? Pus or a bad smell.  Bathing  · Do not take baths, swim, or use a hot tub until your health care provider approves. Ask your health care provider if you may take showers. You may only be allowed to take sponge baths.  Activity  · Do not use your knee to support your body weight until your health care provider says that you can. Follow weight-bearing restrictions as told. Use crutches or other devices to help you move around (assistive devices) as directed.  · Ask your health care provider what activities are safe for you during recovery, and what activities you need to avoid.  · If physical therapy was prescribed, do exercises as directed. Doing exercises may help improve knee  movement and flexibility (range of motion).  · Do not lift anything that is heavier than 10 lb (4.5 kg), or the limit that you are told, until your health care provider says that it is safe.  Driving  · Do not drive until your health care provider approves. You may be able to drive after 1-3 weeks.  · Do not drive or use heavy machinery while taking prescription pain medicine.  Managing pain, stiffness, and swelling    · If directed, put ice on the injured area:  ? Put ice in a plastic bag or use the icing device (cold therapy unit) that you were given. Follow instructions from your health care provider about how to use the icing device.  ? Place a towel between your skin and the bag or between your skin and the icing device.  ? Leave the ice on for 20 minutes, 2-3 times a day.  · Move your toes often to avoid stiffness and to lessen swelling.  · Raise (elevate) the injured area above the level of your heart while you are sitting or lying down.  If you are taking blood thinners:  · Before you take any medicines that contain aspirin or NSAIDs, talk with your health care provider. These medicines increase your risk for dangerous bleeding.  · Take your medicine exactly as told, at the same time every day.  ·   as told by your health care provider. °· If you are taking prescription pain medicine, take actions to prevent or treat constipation. Your health care provider may recommend that you: °? Drink enough fluid to keep your urine pale yellow. °? Eat foods that are high in fiber, such as fresh fruits and vegetables, whole grains, and beans. °? Limit foods that are high in fat and processed sugars, such as fried or sweet foods. °? Take an over-the-counter  or prescription medicines for constipation. °· Do not use any products that contain nicotine or tobacco, such as cigarettes and e-cigarettes. These can delay incision or bone healing. If you need help quitting, ask your health care provider. °· Wear compression stockings as told by your health care provider. These stockings help to prevent blood clots and reduce swelling in your legs. °· Keep all follow-up visits as told by your health care provider. This is important. °Contact a health care provider if you: °· Have a fever. °· Have severe pain. °· Have redness around an incision. °· Have more swelling. °· Have fluid or blood coming from an incision. °· Notice that an incision feels warm to the touch. °· Notice pus or a bad smell coming from an incision. °· Notice that an incision opens up. °· Develop a rash. °Get help right away if you: °· Have difficulty breathing. °· Have shortness of breath. °· Have chest pain. °· Develop pain in your lower leg or at the back of your knee. °· Have numbness or tingling in your lower leg or your foot. °Summary °· Raise (elevate) the injured area above the level of your heart while you are sitting or lying down. °· To help relieve pain and swelling, put ice on your leg for 20 minutes at a time, 2-3 times a day. °· If you were prescribed a blood thinner, avoid activities that could cause injury or bruising, and follow instructions about how to prevent falls. °· If physical therapy was prescribed, do exercises as directed. Doing exercises may help improve range of motion. °This information is not intended to replace advice given to you by your health care provider. Make sure you discuss any questions you have with your health care provider. °Document Released: 06/20/2005 Document Revised: 10/14/2017 Document Reviewed: 10/14/2017 °Elsevier Interactive Patient Education © 2019 [employer] ° °AMBULATORY SURGERY  °DISCHARGE INSTRUCTIONS ° ° °1) The drugs that you were given will stay  in your system until tomorrow so for the next 24 hours you should not: ° °A) Drive an automobile °B) Make any legal decisions °C) Drink any alcoholic beverage ° ° °2) You may resume regular meals tomorrow.  Today it is better to start with liquids and gradually work up to solid foods. ° °You may eat anything you prefer, but it is better to start with liquids, then soup and crackers, and gradually work up to solid foods. ° ° °3) Please notify your doctor immediately if you have any unusual bleeding, trouble breathing, redness and pain at the surgery site, drainage, fever, or pain not relieved by medication. ° ° ° °4) Additional Instructions: ° ° ° ° ° ° ° °Please contact your physician with any problems or Same Day Surgery at [phone], Monday through Friday 6 am to 4 pm, or [facility] number at [phone]. ° °

## 2018-12-14 NOTE — Transfer of Care (Signed)
Immediate Anesthesia Transfer of Care Note  Patient: Duane Crawford  Procedure(s) Performed: ARTHROSCOPY KNEE WITH MEDIAL MENISCECTOMY (Right Knee)  Patient Location: PACU  Anesthesia Type:General  Level of Consciousness: awake and oriented  Airway & Oxygen Therapy: Patient Spontanous Breathing and Patient connected to face mask  Post-op Assessment: Report given to RN and Post -op Vital signs reviewed and stable  Post vital signs: Reviewed and stable  Last Vitals:  Vitals Value Taken Time  BP    Temp    Pulse 72 12/14/2018  1:40 PM  Resp    SpO2 99 % 12/14/2018  1:40 PM    Last Pain:  Vitals:   12/14/18 1000  TempSrc: Tympanic  PainSc: 0-No pain         Complications: No apparent anesthesia complications

## 2018-12-17 ENCOUNTER — Encounter: Payer: Self-pay | Admitting: Orthopedic Surgery

## 2018-12-19 IMAGING — CT CT ABD-PEL WO/W CM
2 of 6 series · 13 of 32 positions shown, 18 images · IV contrast (APPLIED)
Comparison: 07/21/2016

CLINICAL DATA: Gross hematuria and back pain.

EXAM:
CT ABDOMEN AND PELVIS WITHOUT AND WITH CONTRAST
TECHNIQUE: Multidetector CT imaging of the abdomen and pelvis was performed
following the standard protocol before and following the bolus
administration of intravenous contrast.
CONTRAST:  125mL W6CULM-P88 IOPAMIDOL (W6CULM-P88) INJECTION 61%

[Series 2: axial pre · axial · non-contrast · 0.92mm/px · z∈[-1006,-626]mm · 7 of 102 slices shown]
[im 13/102  soft-tissue]
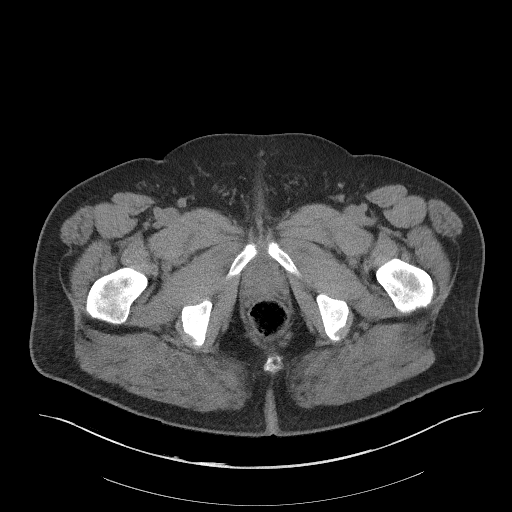
[im 26/102  soft-tissue]
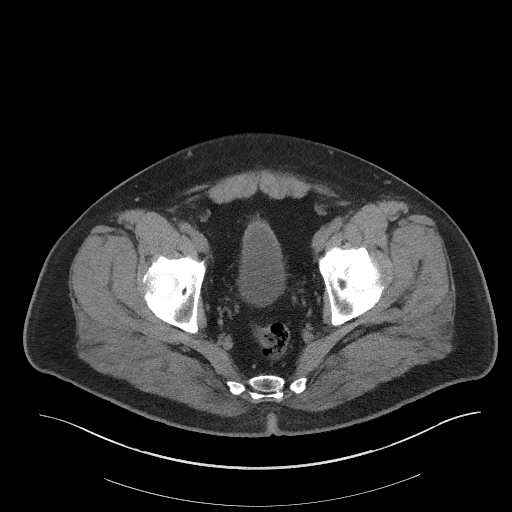
[im 38/102  soft-tissue]
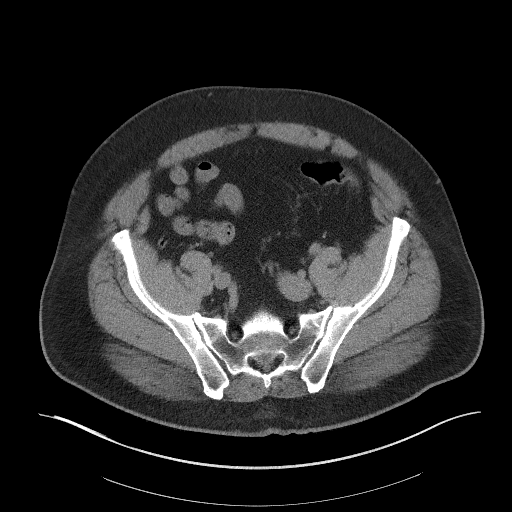
[im 51/102  soft-tissue]
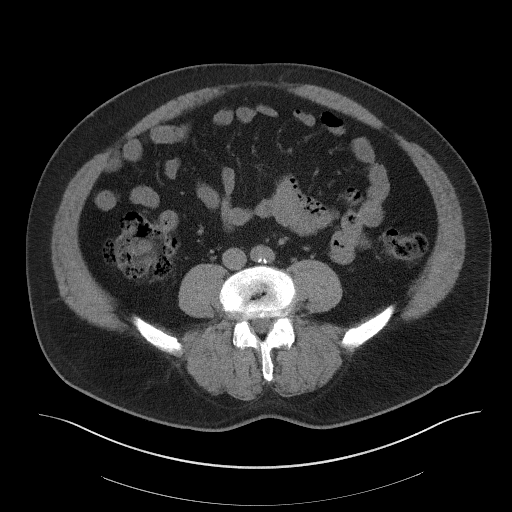
[im 64/102  soft-tissue]
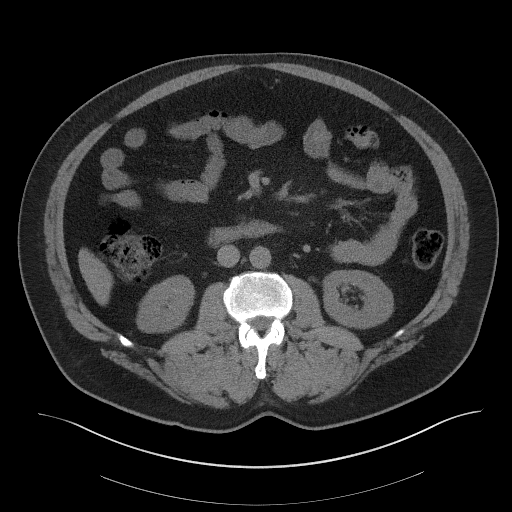
[im 76/102  soft-tissue]
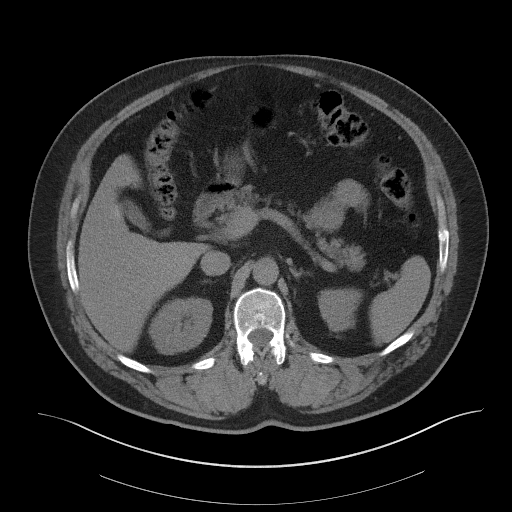
[im 89/102  soft-tissue]
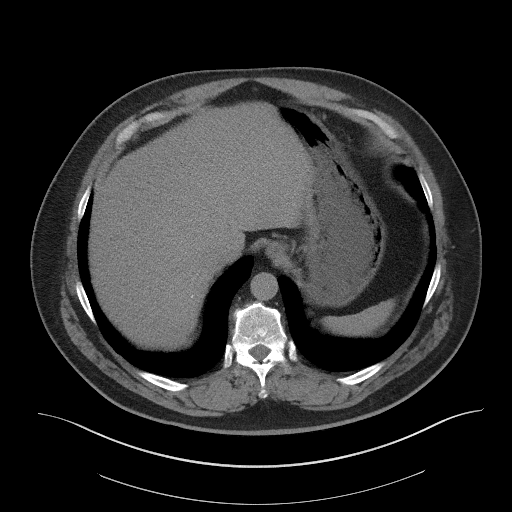

[Series 13: axial delay · axial · delayed · 0.92mm/px · z∈[-986,-601]mm · 6 of 109 slices shown, 11 images]
[im 16/109  soft-tissue]
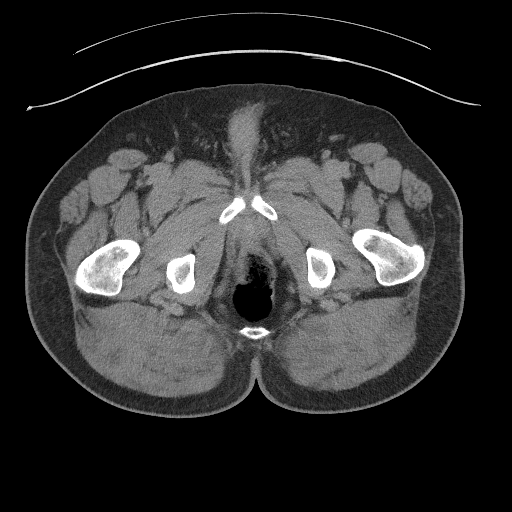
[im 16/109  bone]
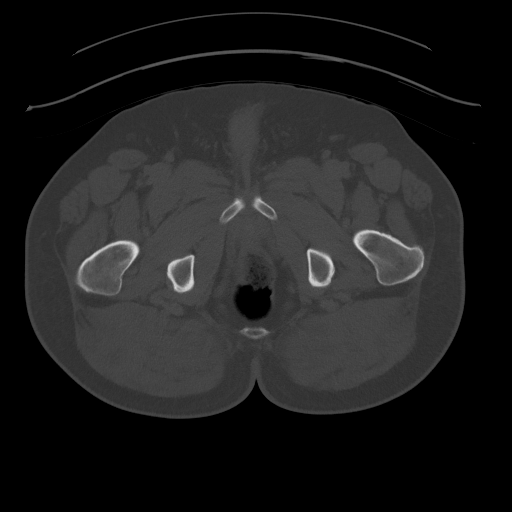
[im 31/109  soft-tissue]
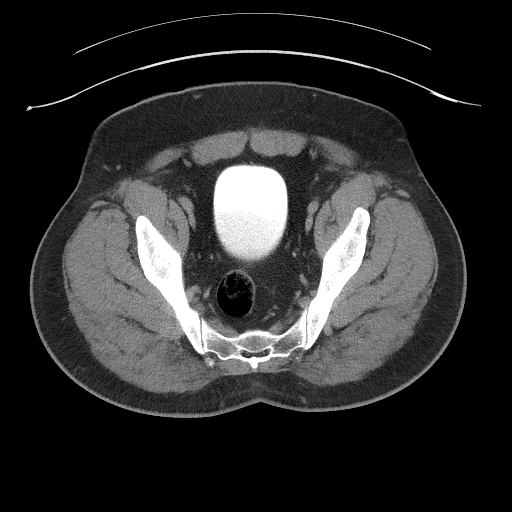
[im 47/109  soft-tissue]
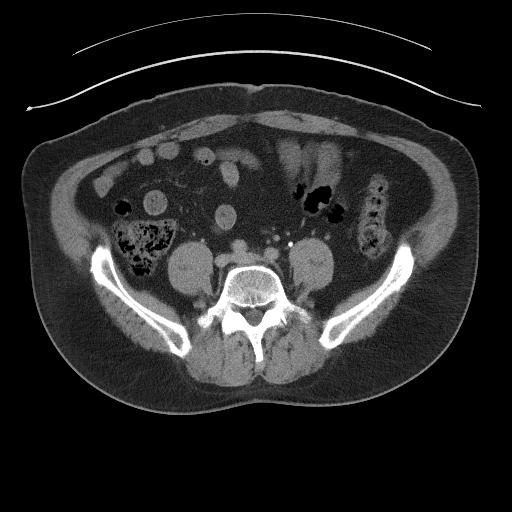
[im 47/109  lung]
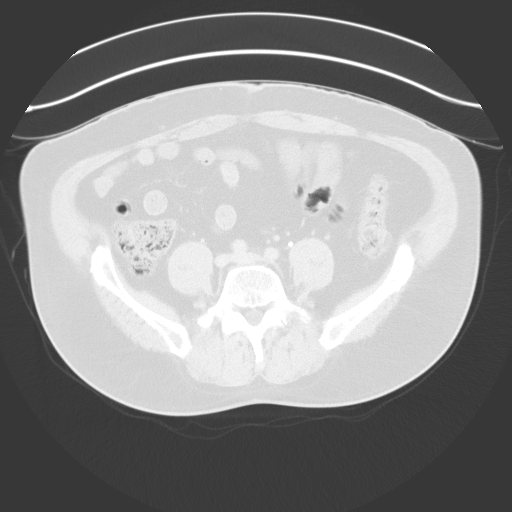
[im 62/109  soft-tissue]
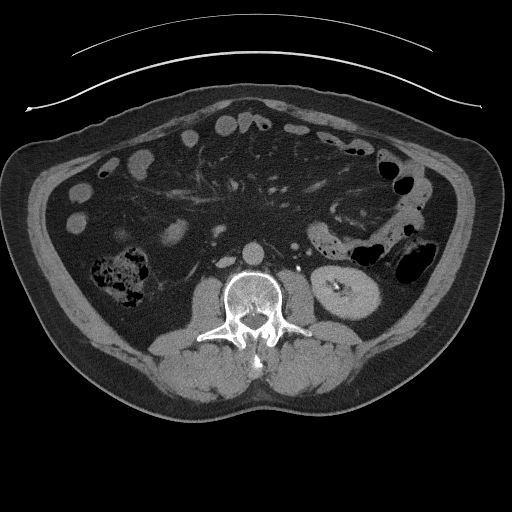
[im 62/109  lung]
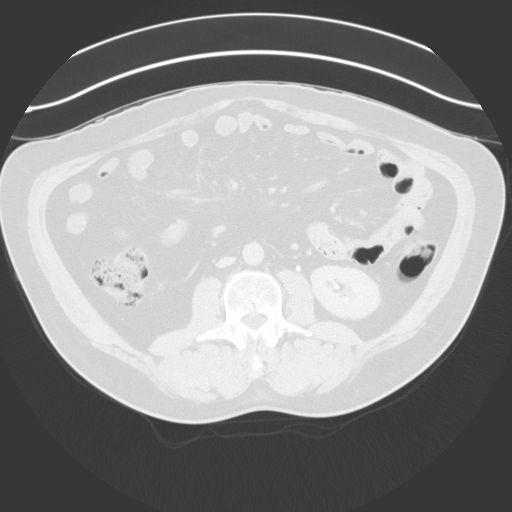
[im 78/109  soft-tissue]
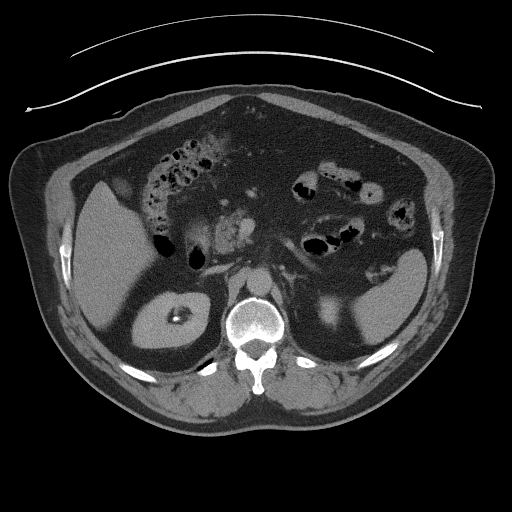
[im 78/109  lung]
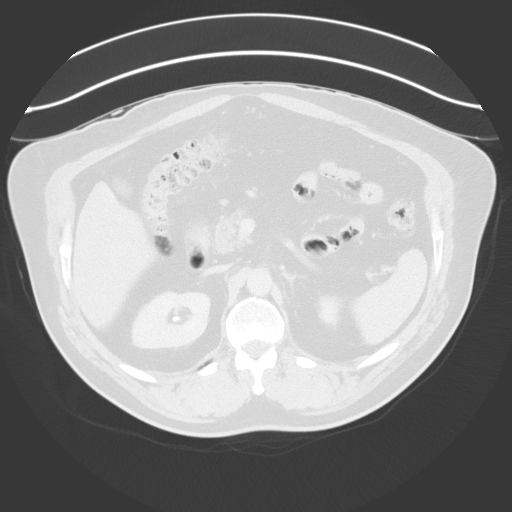
[im 93/109  soft-tissue]
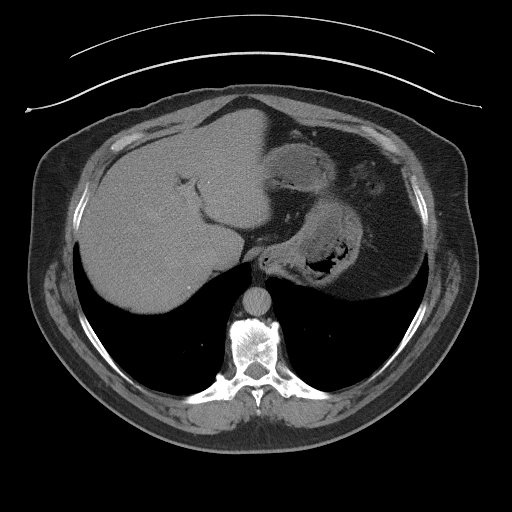
[im 93/109  lung]
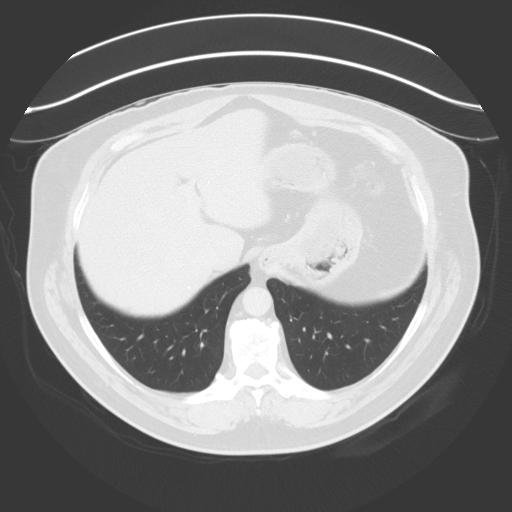

[13 of 32 positions shown; findings below may reference images not displayed]

FINDINGS: Lower chest: No acute abnormality.

Hepatobiliary: No focal liver abnormality. The gallbladder appears
normal. No biliary dilatation.

Pancreas: Unremarkable. No pancreatic ductal dilatation or
surrounding inflammatory changes.

Spleen: Normal in size without focal abnormality.

Adrenals/Urinary Tract: Normal adrenal glands.

No kidney stones are identified. There is no hydronephrosis or
hydroureter. No ureteral lithiasis identified. The urinary bladder
appears normal. 6 mm low density structure in the anterior cortex of
the left kidney is too small to characterize. Symmetric excretion of
contrast material from both kidneys. No urinary tract lesions
identified.

Stomach/Bowel: The stomach appears normal. Duodenal diverticula
identified. The small bowel loops have a normal course and caliber.
The appendix is visualized and appears normal. No pathologic
dilatation of the colon. Distal colonic diverticula noted without
acute inflammation.

Vascular/Lymphatic: Aortic atherosclerosis. No aneurysm. No
abdominal or pelvic adenopathy. No inguinal adenopathy.

Reproductive: Prostate is unremarkable.

Other: No abdominal wall hernia or abnormality. No abdominopelvic
ascites.

Musculoskeletal: Degenerative disc disease is noted within the
thoracic and lower lumbar spine. No suspicious bone lesions.
IMPRESSION: 1. No acute findings within the abdomen or pelvis.
2. No explanation for right lower back pain and gross hematuria. No
renal calculi noted.

## 2019-02-16 DIAGNOSIS — M13819 Other specified arthritis, unspecified shoulder: Secondary | ICD-10-CM | POA: Diagnosis not present

## 2019-12-06 ENCOUNTER — Encounter: Payer: Self-pay | Admitting: Emergency Medicine

## 2019-12-06 ENCOUNTER — Ambulatory Visit
Admission: EM | Admit: 2019-12-06 | Discharge: 2019-12-06 | Disposition: A | Discharge status: Home / Self Care | Payer: 59 | Attending: Urgent Care | Admitting: Urgent Care

## 2019-12-06 ENCOUNTER — Other Ambulatory Visit: Payer: Self-pay

## 2019-12-06 DIAGNOSIS — R0981 Nasal congestion: Secondary | ICD-10-CM

## 2019-12-06 DIAGNOSIS — R509 Fever, unspecified: Secondary | ICD-10-CM | POA: Diagnosis not present

## 2019-12-06 DIAGNOSIS — R05 Cough: Secondary | ICD-10-CM

## 2019-12-06 DIAGNOSIS — Z20822 Contact with and (suspected) exposure to covid-19: Secondary | ICD-10-CM

## 2019-12-06 DIAGNOSIS — J329 Chronic sinusitis, unspecified: Secondary | ICD-10-CM

## 2019-12-06 DIAGNOSIS — Z20828 Contact with and (suspected) exposure to other viral communicable diseases: Secondary | ICD-10-CM

## 2019-12-06 MED ORDER — AZITHROMYCIN 250 MG PO TABS: 250.0000 mg | ORAL_TABLET | Freq: Every day | ORAL | 0 refills | Status: DC

## 2019-12-06 NOTE — ED Provider Notes (Signed)
[name]   Name: Duane Crawford DOB: 11-07-1961 MRN: [id number] CSN: [id number] PCP: Rusty Aus, MD  Arrival date and time:  12/06/19 [phone]  Chief Complaint:  Nasal Congestion, Fever, and Cough   NOTE: Prior to seeing the patient today, I have reviewed the triage nursing documentation and vital signs. Clinical staff has updated patient's PMH/PSHx, current medication list, and drug allergies/intolerances to ensure comprehensive history available to assist in medical decision making.   History:   HPI: Duane Crawford is a 58 y.o. male who presents today with complaints of cough, congestion, and frontal sinus pressure that has been presents to varying degrees for the last month. PMH (+) for recurrent sinus symptoms due to known deviated septum. Patient reports worsening of his symptoms over the course of the last 4 days. Patient endorses fevers, however he is unable to report a Tmax. Cough has been minimally productive of yellow sputum. He denies that he has experienced any nausea, vomiting, diarrhea, or abdominal pain. He is drinking well, however reports a decreased appetite overall. Patient denies any perceived alterations to his sense of taste or smell. Patient denies being in close contact with anyone known to be ill; no one else is his home has experienced a similar symptom constellation. He has never been tested for SARS-CoV-2 (novel coronavirus) in the past per his report. Patient has not been vaccinated for influenza this season. In efforts to conservatively manage his symptoms at home, the patient notes that he has used fluticasone and Alka Seltzer cold/flu, which has helped to improve his symptoms some.     Past Medical History:  Diagnosis Date  . Anxiety   . Arthritis   . Back pain   . Gross hematuria     Past Surgical History:  Procedure Laterality Date  . CARPAL TUNNEL RELEASE Right 2015  . CERVICAL SPINE SURGERY    . KNEE ARTHROSCOPY Right 12/14/2018   Procedure: ARTHROSCOPY KNEE WITH MEDIAL MENISCECTOMY;  Surgeon: Thornton Park, MD;  Location: [facility] ORS;  Service: Orthopedics;  Laterality: Right;    Family History  Problem Relation Age of Onset  . Prostate cancer Neg Hx   . Kidney disease Neg Hx     Social History   Tobacco Use  . Smoking status: Never Smoker  . Smokeless tobacco: Never Used  Substance Use Topics  . Alcohol use: Yes    Comment: [facility] BEER  . Drug use: No    There are no problems to display for this patient.   Home Medications:    No outpatient medications have been marked as taking for the 12/06/19 encounter [facility] - Inpatient Encounter).    Allergies:   Etodolac  Review of Systems (ROS): Review of Systems  Constitutional: Positive for appetite change (decreased) and fever. Negative for fatigue.  HENT: Positive for congestion, sinus pressure and sinus pain. Negative for ear pain, postnasal drip, rhinorrhea, sneezing and sore throat.   Eyes: Negative for pain, discharge and redness.  Respiratory: Positive for cough. Negative for chest tightness and shortness of breath.   Cardiovascular: Negative for chest pain and palpitations.  Gastrointestinal: Negative for abdominal pain, diarrhea, nausea and vomiting.  Musculoskeletal: Negative for arthralgias, back pain, myalgias and neck pain.  Skin: Negative for color change, pallor and rash.  Neurological: Negative for dizziness, syncope, weakness and headaches.  Hematological: Negative for adenopathy.     Vital Signs: Today's Vitals   12/06/19 0908 12/06/19 0910 12/06/19 0929  BP:  (!) 132/94   Pulse:  88   Resp: 18    Temp: 98.4 F (36.9 C)    TempSrc: Oral    SpO2: 98%    Weight:  255 lb (115.7 kg)   Height:  5\' 11"  (1.803 m)   PainSc: 8   8     Physical Exam: Physical Exam  Constitutional: He is oriented to person, place, and time and well-developed, well-nourished, and in no distress.  HENT:  Head: Normocephalic and atraumatic.  Right Ear:  Tympanic membrane normal.  Left Ear: Tympanic membrane normal.  Nose: Mucosal edema, rhinorrhea and sinus tenderness (frontal; L>R) present.  Mouth/Throat: Uvula is midline and mucous membranes are normal. Posterior oropharyngeal erythema present. No posterior oropharyngeal edema.  Eyes: Pupils are equal, round, and reactive to light.  Cardiovascular: Normal rate, regular rhythm, normal heart sounds and intact distal pulses.  Pulmonary/Chest: Effort normal and breath sounds normal.  Neurological: He is alert and oriented to person, place, and time. Gait normal.  Skin: Skin is warm and dry. No rash noted. He is not diaphoretic.  Psychiatric: Mood, memory, affect and judgment normal.  Nursing note and vitals reviewed.   Urgent Care Treatments / Results:   Orders Placed This Encounter  Procedures  . Novel Coronavirus, NAA (Hosp order, Send-out to Ref Lab; TAT 18-24 hrs    LABS: PLEASE NOTE: all labs that were ordered this encounter are listed, however only abnormal results are displayed. Labs Reviewed  NOVEL CORONAVIRUS, NAA (HOSP ORDER, SEND-OUT TO REF LAB; TAT 18-24 HRS)    EKG: -None  RADIOLOGY: No results found.  PROCEDURES: Procedures  MEDICATIONS RECEIVED THIS VISIT: Medications - No data to display  PERTINENT CLINICAL COURSE NOTES/UPDATES:   Initial Impression / Assessment and Plan / Urgent Care Course:  Pertinent labs & imaging results that were available during my care of the patient were personally reviewed by me and considered in my medical decision making (see lab/imaging section of note for values and interpretations).  Duane Crawford is a 58 y.o. male who [facility] with complaints of Nasal Congestion, Fever, and Cough   Patient overall well appearing and in no acute distress today in clinic. Presenting symptoms (see HPI) and exam as documented above. He presents with symptoms associated with SARS-CoV-2 (novel coronavirus).  Discussed typical symptom constellation. Reviewed potential for infection and need for testing. Patient amenable to being tested. SARS-CoV-2 swab collected by certified clinical staff. Discussed variable turn around times associated with testing, as swabs are being [facility] at [facility], and have been taking between 2-5 days to come back. He was advised to self quarantine, per [facility] guidelines, until negative results received. These measures are being implemented out of an abundance of caution to prevent transmission and spread during the current SARS-CoV-2 pandemic.  Given exam, PMH, and chronicity of symptoms, will proceed with treatment. Patient advising that may antibiotics are ineffective for him. He is asking for a azithromycin. Will oblige request and send in a 5 day course of azithromycin. He is to continue the fluticasone and Catering manager[employer]ka Seltzer. Discussed supportive care measures at home during acute phase of illness. Patient to rest as much as possible. He was encouraged to ensure adequate hydration (water and ORS) to prevent dehydration and electrolyte derangements. Patient may use APAP and/or IBU on an as needed basis for pain/fever. Current clinical condition warrants patient being out of work in order to quarantine while waiting for testing results. He is already out of work until 12/13/2019 for the  holidays, therefore does not require documentation for his job.   Discussed follow up with primary care physician in 1 week for re-evaluation. I have reviewed the follow up and strict return precautions for any new or worsening symptoms. Patient is aware of symptoms that would be deemed urgent/emergent, and would thus require further evaluation either here or in the emergency department. At the time of discharge, he verbalized understanding and consent with the discharge plan as it was reviewed with him. All questions were fielded by provider and/or clinic staff prior to patient discharge.    Final  Clinical Impressions / Urgent Care Diagnoses:   Final diagnoses:  Sinusitis, unspecified chronicity, unspecified location  Encounter for laboratory testing for COVID-19 virus    New Prescriptions:  [place] Controlled Substance Registry consulted? Not Applicable  Meds ordered this encounter  Medications  . azithromycin (ZITHROMAX) 250 MG tablet    Sig: Take 1 tablet (250 mg total) by mouth daily. Take first 2 tablets together, then 1 every day until finished.    Dispense:  6 tablet    Refill:  0    Recommended Follow up Care:  Patient encouraged to follow up with the following provider within the specified time frame, or sooner as dictated by the severity of his symptoms. As always, he was instructed that for any urgent/emergent care needs, he should seek care either here or in the emergency department for more immediate evaluation.  Follow-up Information    Danella Penton, MD In 1 week.   Specialty: Internal Medicine Why: General reassessment of symptoms if not improving Contact information: [address] [facility] [place] [address] [phone]         NOTE: This note was prepared using Dragon dictation software along with smaller phrase technology. Despite my best ability to proofread, there is the potential that transcriptional errors may still occur from this process, and are completely unintentional.    Verlee Monte, NP 12/06/19 1007

## 2019-12-06 NOTE — Discharge Instructions (Signed)
It was very nice seeing you today in clinic. Thank you for entrusting me with your care.   Rest and stay HYDRATED. Water and electrolyte containing beverages (Gatorade, Pedialyte) are best to prevent dehydration and electrolyte abnormalities.  May use Tylenol and/or Ibuprofen as needed for pain/fever.   Please utilize the medications that we discussed. Your prescriptions has been called in to your pharmacy.   You were tested for SARS-CoV-2 (novel coronavirus) today. Testing is performed by an outside lab ([facility]) and has variable turn around times ranging between 2-5 days. Current recommendations from the the CDC and [place] DHHS require that you remain out of work in order to quarantine at home until negative test results are have been received. In the event that your test results are positive, you will be contacted with further directives. These measures are being implemented out of an abundance of caution to prevent transmission and spread during the current SARS-CoV-2 pandemic.  Make arrangements to follow up with your regular doctor in 1 week for re-evaluation if not improving. If your symptoms/condition worsens, please seek follow up care either here or in the ER. Please remember, our [place] providers are "right here with you" when you need us.   Again, it was my pleasure to take care of you today. Thank you for choosing our clinic. I hope that you start to feel better quickly.   Cerena Baine, MSN, APRN, FNP-C, CEN Advanced Practice Provider [facility]  

## 2019-12-06 NOTE — ED Triage Notes (Signed)
Patient c/o nasal congestion, fever, cough x 4 days. He states he has been having sinus problems x 4 weeks.

## 2019-12-07 ENCOUNTER — Telehealth: Payer: Self-pay | Admitting: Emergency Medicine

## 2019-12-07 LAB — NOVEL CORONAVIRUS, NAA (HOSP ORDER, SEND-OUT TO REF LAB; TAT 18-24 HRS): SARS-CoV-2, NAA: DETECTED — AB

## 2019-12-07 NOTE — Telephone Encounter (Signed)
Your test for COVID-19 was positive, meaning that you were infected with the novel coronavirus and could give the germ to others.  Please continue isolation at home for at least 10 days since the start of your symptoms. If you do not have symptoms, please isolate at home for 10 days from the day you were tested. Once you complete your 10 day quarantine, you may return to normal activities as long as you've not had a fever for over 24 hours(without taking fever reducing medicine) and your symptoms are improving. Please continue good preventive care measures, including:  frequent hand-washing, avoid touching your face, cover coughs/sneezes, stay out of crowds and keep a 6 foot distance from others.  Go to the nearest hospital emergency room if fever/cough/breathlessness are severe or illness seems like a threat to life.  Patient contacted by phone and made aware of    results. Pt verbalized understanding and had all questions answered.  Quarantine ends Jan 1st  

## 2023-09-01 ENCOUNTER — Ambulatory Visit (INDEPENDENT_AMBULATORY_CARE_PROVIDER_SITE_OTHER): Payer: 59

## 2023-09-01 ENCOUNTER — Ambulatory Visit
Admission: EM | Admit: 2023-09-01 | Discharge: 2023-09-01 | Disposition: A | Discharge status: Home / Self Care | Payer: 59 | Attending: Emergency Medicine | Admitting: Emergency Medicine

## 2023-09-01 DIAGNOSIS — R051 Acute cough: Secondary | ICD-10-CM

## 2023-09-01 DIAGNOSIS — J189 Pneumonia, unspecified organism: Secondary | ICD-10-CM

## 2023-09-01 DIAGNOSIS — R030 Elevated blood-pressure reading, without diagnosis of hypertension: Secondary | ICD-10-CM

## 2023-09-01 MED ORDER — DOXYCYCLINE HYCLATE 100 MG PO CAPS: 100.0000 mg | ORAL_CAPSULE | Freq: Two times a day (BID) | ORAL | 0 refills | Status: AC

## 2023-09-01 MED ORDER — BENZONATATE 100 MG PO CAPS: 100.0000 mg | ORAL_CAPSULE | Freq: Three times a day (TID) | ORAL | 0 refills | Status: DC

## 2023-09-01 NOTE — ED Provider Notes (Signed)
[facility]    CSN: [id number] Arrival date & time: 09/01/23  1214      History   Chief Complaint Chief Complaint  Patient presents with   Cough    HPI Duane Crawford is a 62 y.o. male.   62 year old male pt, Duane Crawford, presents to urgent care for evaluation of cough x 2 months. Pt denies any recent illness or sick exposure.  The history is provided by the patient. No language interpreter was used.    Past Medical History:  Diagnosis Date   Anxiety    Arthritis    Back pain    Gross hematuria     Patient Active Problem List   Diagnosis Date Noted   Community acquired pneumonia of right lower lobe of lung 09/01/2023   Elevated blood pressure reading 09/01/2023   Acute cough 09/01/2023    Past Surgical History:  Procedure Laterality Date   CARPAL TUNNEL RELEASE Right 2015   CERVICAL SPINE SURGERY     KNEE ARTHROSCOPY Right 12/14/2018   Procedure: ARTHROSCOPY KNEE WITH MEDIAL MENISCECTOMY;  Surgeon: Juanell Fairly, MD;  Location: [facility] ORS;  Service: Orthopedics;  Laterality: Right;       Home Medications    Prior to Admission medications   Medication Sig Start Date End Date Taking? Authorizing Provider  benzonatate (TESSALON) 100 MG capsule Take 1 capsule (100 mg total) by mouth every 8 (eight) hours. 09/01/23  Yes Giuliano Preece, Para March, NP  doxycycline (VIBRAMYCIN) 100 MG capsule Take 1 capsule (100 mg total) by mouth 2 (two) times daily for 7 days. 09/01/23 09/08/23 Yes Kasaundra Fahrney, Para March, NP  azithromycin (ZITHROMAX) 250 MG tablet Take 1 tablet (250 mg total) by mouth daily. Take first 2 tablets together, then 1 every day until finished. 12/06/19   Verlee Monte, NP    Family History Family History  Problem Relation Age of Onset   Prostate cancer Neg Hx    Kidney disease Neg Hx     Social History Social History   Tobacco Use   Smoking status: Never   Smokeless tobacco: Never  Vaping Use   Vaping status: Never Used  Substance Use  Topics   Alcohol use: Yes    Comment: OCC BEER   Drug use: No     Allergies   Etodolac   Review of Systems Review of Systems  Constitutional:  Negative for chills and fever.  Respiratory:  Positive for cough. Negative for wheezing.   Cardiovascular:  Negative for chest pain and palpitations.  All other systems reviewed and are negative.    Physical Exam Triage Vital Signs ED Triage Vitals  Encounter Vitals Group     BP 09/01/23 1343 (!) 143/88     Systolic BP Percentile --      Diastolic BP Percentile --      Pulse Rate 09/01/23 1343 81     Resp 09/01/23 1343 16     Temp 09/01/23 1343 98.2 F (36.8 C)     Temp Source 09/01/23 1343 Oral     SpO2 09/01/23 1343 99 %     Weight 09/01/23 1343 243 lb (110.2 kg)     Height 09/01/23 1343 5\' 11"  (1.803 m)     Head Circumference --      Peak Flow --      Pain Score 09/01/23 1346 0     Pain Loc --      Pain Education --      Exclude from [employer]  Chart --    No data found.  Updated Vital Signs BP (!) 143/88 (BP Location: Left Arm)   Pulse 81   Temp 98.2 F (36.8 C) (Oral)   Resp 16   Ht 5\' 11"  (1.803 m)   Wt 243 lb (110.2 kg)   SpO2 99%   BMI 33.89 kg/m   Visual Acuity Right Eye Distance:   Left Eye Distance:   Bilateral Distance:    Right Eye Near:   Left Eye Near:    Bilateral Near:     Physical Exam Vitals and nursing note reviewed.  Constitutional:      General: He is not in acute distress.    Appearance: He is well-developed. He is not ill-appearing or toxic-appearing.  HENT:     Head: Normocephalic.     Right Ear: Tympanic membrane is retracted.     Left Ear: Tympanic membrane is retracted.     Nose: Mucosal edema and congestion present.     Mouth/Throat:     Mouth: Mucous membranes are moist.     Pharynx: Uvula midline.  Eyes:     General: Lids are normal.     Conjunctiva/sclera: Conjunctivae normal.     Pupils: Pupils are equal, round, and reactive to light.  Cardiovascular:     Rate and  Rhythm: Normal rate and regular rhythm.     Heart sounds: Normal heart sounds.  Pulmonary:     Effort: Pulmonary effort is normal. No respiratory distress.     Breath sounds: Normal breath sounds and air entry. No decreased breath sounds or wheezing.  Abdominal:     General: There is no distension.     Palpations: Abdomen is soft.  Musculoskeletal:        General: Normal range of motion.     Cervical back: Normal range of motion.  Skin:    General: Skin is warm and dry.     Findings: No rash.  Neurological:     General: No focal deficit present.     Mental Status: He is alert and oriented to person, place, and time.     GCS: GCS eye subscore is 4. GCS verbal subscore is 5. GCS motor subscore is 6.     Cranial Nerves: No cranial nerve deficit.     Sensory: No sensory deficit.  Psychiatric:        Attention and Perception: Attention normal.        Mood and Affect: Mood normal.        Speech: Speech normal.        Behavior: Behavior normal. Behavior is cooperative.      UC Treatments / Results  Labs (all labs ordered are listed, but only abnormal results are displayed) Labs Reviewed - No data to display  EKG   Radiology DG Chest 2 View  Result Date: 09/01/2023 CLINICAL DATA:  Worsening cough for the past 2 months. EXAM: CHEST - 2 VIEW COMPARISON:  None Available. FINDINGS: Normal sized heart. Mild aortic arch calcifications. Mild diffuse peribronchial thickening and accentuation of the interstitial markings. Minimal patchy opacity at the right lateral lung base. Thoracic spine degenerative changes with changes of DISH. Lower cervical spine interbody fixation hardware. IMPRESSION: 1. Mild bronchitic changes. 2. Minimal right basilar atelectasis or pneumonia. Electronically Signed   By: Beckie Salts M.D.   On: 09/01/2023 16:31    Procedures Procedures (including critical care time)  Medications Ordered in UC Medications - No data to display  Initial Impression /  Assessment  and Plan / UC Course  I have reviewed the triage vital signs and the nursing notes.  Pertinent labs & imaging results that were available during my care of the patient were reviewed by me and considered in my medical decision making (see chart for details).    Discussed exam findings and plan of care with patient, strict go to ER precautions given.   Patient verbalized understanding to this provider.  Ddx: Pneumonia, cough, elevated blood pressure , allergies Final Clinical Impressions(s) / UC Diagnoses   Final diagnoses:  Elevated blood pressure reading  Community acquired pneumonia of right lower lobe of lung  Acute cough     Discharge Instructions      Your xray showed: Right sided pneumonioa we are treating you with antibiotics. Take tessalon for cough.   Drink plenty of water. Take antibiotic as prescribed(doxycycline, take flonase over the counter for nasal congestion). Follow up with PCP, return as needed.      ED Prescriptions     Medication Sig Dispense Auth. Provider   doxycycline (VIBRAMYCIN) 100 MG capsule Take 1 capsule (100 mg total) by mouth 2 (two) times daily for 7 days. 14 capsule Darlen Gledhill, NP   benzonatate (TESSALON) 100 MG capsule Take 1 capsule (100 mg total) by mouth every 8 (eight) hours. 21 capsule Amisadai Woodford, Para March, NP      PDMP not reviewed this encounter.   Clancy Gourd, NP 09/01/23 2100

## 2023-09-01 NOTE — ED Triage Notes (Signed)
Pt c/o cough x60 days. States cough getting worse daily. Has tried delsym w/o relief. Denies any SOB.

## 2023-09-01 NOTE — Discharge Instructions (Addendum)
Your xray showed: Right sided pneumonioa we are treating you with antibiotics. Take tessalon for cough.   Drink plenty of water. Take antibiotic as prescribed(doxycycline, take flonase over the counter for nasal congestion). Follow up with PCP, return as needed.

## 2023-09-30 ENCOUNTER — Ambulatory Visit
Admission: EM | Admit: 2023-09-30 | Discharge: 2023-09-30 | Disposition: A | Discharge status: Home / Self Care | Payer: 59 | Attending: Family Medicine | Admitting: Family Medicine

## 2023-09-30 ENCOUNTER — Ambulatory Visit (INDEPENDENT_AMBULATORY_CARE_PROVIDER_SITE_OTHER): Payer: 59

## 2023-09-30 DIAGNOSIS — J189 Pneumonia, unspecified organism: Secondary | ICD-10-CM

## 2023-09-30 MED ORDER — PREDNISONE 10 MG (21) PO TBPK: ORAL_TABLET | Freq: Every day | ORAL | 0 refills | Status: AC

## 2023-09-30 MED ORDER — BENZONATATE 100 MG PO CAPS: 100.0000 mg | ORAL_CAPSULE | Freq: Three times a day (TID) | ORAL | 0 refills | Status: AC

## 2023-09-30 MED ORDER — AZITHROMYCIN 250 MG PO TABS: 250.0000 mg | ORAL_TABLET | Freq: Every day | ORAL | 0 refills | Status: AC

## 2023-09-30 MED ORDER — AMOXICILLIN-POT CLAVULANATE 875-125 MG PO TABS: 1.0000 | ORAL_TABLET | Freq: Two times a day (BID) | ORAL | 0 refills | Status: AC

## 2023-09-30 NOTE — ED Provider Notes (Signed)
[facility]    CSN: [id number] Arrival date & time: 09/30/23  0816      History   Chief Complaint Chief Complaint  Patient presents with   Chest Congestion    HPI Duane Crawford is a 62 y.o. male.   HPI  History obtained from the patient. Erven presents for persistent cough for the past 3 weeks.  Cough is productive of clear to yellow and white sputum.  He was recently treated for right lower lobe pneumonia with doxycycline and Tessalon Perles states he felt better while taking the medicine but then the cough just never really went away.  Denies any recent fever.  He has been more tired, having some chest tightness and shortness of breath.  Denies history of smoking and asthma.       Past Medical History:  Diagnosis Date   Anxiety    Arthritis    Back pain    Gross hematuria     Patient Active Problem List   Diagnosis Date Noted   Community acquired pneumonia of right lower lobe of lung 09/01/2023   Elevated blood pressure reading 09/01/2023   Acute cough 09/01/2023    Past Surgical History:  Procedure Laterality Date   CARPAL TUNNEL RELEASE Right 2015   CERVICAL SPINE SURGERY     KNEE ARTHROSCOPY Right 12/14/2018   Procedure: ARTHROSCOPY KNEE WITH MEDIAL MENISCECTOMY;  Surgeon: Juanell Fairly, MD;  Location: [facility] OR[facility];  Service: Orthopedics;  Laterality: Right;       Home Medications    Prior to Admission medications   Medication Sig Start Date End Date Taking? Authorizing Provider  amoxicillin-clavulanate (AUGMENTIN) 875-125 MG tablet Take 1 tablet by mouth every 12 (twelve) hours for 5 days. 09/30/23 10/05/23 Yes Arthurine Oleary, DO  predniSONE (STERAPRED UNI-PAK 21 TAB) 10 MG (21) TBPK tablet Take by mouth daily. Take 6 tabs by mouth daily for 1, then 5 tabs for 1 day, then 4 tabs for 1 day, then 3 tabs for 1 day, then 2 tabs for 1 day, then 1 tab for 1 day. 09/30/23  Yes Miosotis Wetsel, DO  azithromycin (ZITHROMAX) 250 MG tablet  Take 1 tablet (250 mg total) by mouth daily. Take first 2 tablets together, then 1 every day until finished. 09/30/23   Ebony Rickel, Seward Meth, DO  benzonatate (TESSALON) 100 MG capsule Take 1 capsule (100 mg total) by mouth every 8 (eight) hours. 09/30/23   Katha Cabal, DO    Family History Family History  Problem Relation Age of Onset   Prostate cancer Neg Hx    Kidney disease Neg Hx     Social History Social History   Tobacco Use   Smoking status: Never   Smokeless tobacco: Never  Vaping Use   Vaping status: Never Used  Substance Use Topics   Alcohol use: Yes    Comment: OCC BEER   Drug use: No     Allergies   Etodolac   Review of Systems Review of Systems: negative unless otherwise stated in HPI.      Physical Exam Triage Vital Signs ED Triage Vitals  Encounter Vitals Group     BP 09/30/23 0828 122/85     Systolic BP Percentile --      Diastolic BP Percentile --      Pulse Rate 09/30/23 0828 70     Resp 09/30/23 0828 16     Temp 09/30/23 0828 98.7 F (37.1 C)     Temp Source 09/30/23 0828 Oral  SpO2 09/30/23 0828 99 %     Weight 09/30/23 0824 243 lb (110.2 kg)     Height 09/30/23 0824 5\' 11"  (1.803 m)     Head Circumference --      Peak Flow --      Pain Score 09/30/23 0831 0     Pain Loc --      Pain Education --      Exclude from Growth Chart --    No data found.  Updated Vital Signs BP 122/85 (BP Location: Left Arm)   Pulse 70   Temp 98.7 F (37.1 C) (Oral)   Resp 16   Ht 5\' 11"  (1.803 m)   Wt 110.2 kg   SpO2 99%   BMI 33.89 kg/m   Visual Acuity Right Eye Distance:   Left Eye Distance:   Bilateral Distance:    Right Eye Near:   Left Eye Near:    Bilateral Near:     Physical Exam GEN:     alert, non-toxic appearing male in no distress    HENT:  mucus membranes moist, no nasal discharge EYES:   pupils equal and reactive, no scleral injection or discharge NECK:  normal ROM, no meningismus   RESP:  no increased work of breathing,  rales on the mid to lower right lung, no wheezing, no rhonchi CVS:   regular rate and rhythm Skin:   warm and dry    UC Treatments / Results  Labs (all labs ordered are listed, but only abnormal results are displayed) Labs Reviewed - No data to display  EKG   Radiology DG Chest 2 View  Result Date: 09/30/2023 CLINICAL DATA:  Cough and chest congestion for 3 weeks. EXAM: CHEST - 2 VIEW COMPARISON:  09/01/2023 FINDINGS: The heart size and mediastinal contours are within normal limits. Coarse interstitial thickening is again seen in both lung bases, consistent with chronic interstitial disease. No evidence of acute or superimposed pulmonary infiltrate. No evidence of pleural effusion. IMPRESSION: Chronic bibasilar interstitial disease. No acute findings. Electronically Signed   By: Danae Orleans M.D.   On: 09/30/2023 09:54    Procedures Procedures (including critical care time)  Medications Ordered in UC Medications - No data to display  Initial Impression / Assessment and Plan / UC Course  I have reviewed the triage vital signs and the nursing notes.  Pertinent labs & imaging results that were available during my care of the patient were reviewed by me and considered in my medical decision making (see chart for details).     Pt is a 62 y.o. male who presents for 3 weeks of cough that is not improving.  Caydyn is  afebrile here without recent antipyretics. Satting 99% on room air. Overall pt is  non-toxic appearing, well hydrated, without respiratory distress. Pulmonary exam is remarkable for rales in the mid to lower right lung with productive cough.  After shared decision making, we will pursue chest x-ray. COVID  and influenza testing deferred due to length of symptoms.   Chest xray personally reviewed by me with questionable RLL pneumonia but no pleural effusion, cardiomegaly or pneumothorax.   Treat acute acute bronchitis vs pneumonia with with steroids and antibiotics as  below. Tessalon perles refilled.   Typical duration of symptoms discussed. Return and ED precautions given and patient voiced understanding.   Discussed MDM, treatment plan and plan for follow-up with patient who agrees with plan.   Pt called and updated with radiologist impression.  He will  follow up with a primary care provider to discuss pulmonary function tests to evaluate for severity of interstitial lung disease.    Final Clinical Impressions(s) / UC Diagnoses   Final diagnoses:  Pneumonia of right lower lobe due to infectious organism     Discharge Instructions      You have evidence of pneumonia on your chest xray.  Stop by the pharmacy to pick up your prescriptions.      ED Prescriptions     Medication Sig Dispense Auth. Provider   azithromycin (ZITHROMAX) 250 MG tablet Take 1 tablet (250 mg total) by mouth daily. Take first 2 tablets together, then 1 every day until finished. 6 tablet Idelle Reimann, DO   benzonatate (TESSALON) 100 MG capsule Take 1 capsule (100 mg total) by mouth every 8 (eight) hours. 21 capsule Dru Primeau, DO   amoxicillin-clavulanate (AUGMENTIN) 875-125 MG tablet Take 1 tablet by mouth every 12 (twelve) hours for 5 days. 10 tablet Lajuanna Pompa, DO   predniSONE (STERAPRED UNI-PAK 21 TAB) 10 MG (21) TBPK tablet Take by mouth daily. Take 6 tabs by mouth daily for 1, then 5 tabs for 1 day, then 4 tabs for 1 day, then 3 tabs for 1 day, then 2 tabs for 1 day, then 1 tab for 1 day. 21 tablet Katha Cabal, DO      PDMP not reviewed this encounter.   Katha Cabal, DO 09/30/23 1014

## 2023-09-30 NOTE — ED Triage Notes (Signed)
Pt c/o chest congestion x3 wks. Was seen on 9/17 for the same issue. Was dx w/R lower lobe pneumonia. Was given benzonatate & doxy w/o relief. Denies any CP or SOB.

## 2023-09-30 NOTE — Discharge Instructions (Addendum)
You have evidence of pneumonia on your chest xray.  Stop by the pharmacy to pick up your prescriptions.

## 2023-10-14 ENCOUNTER — Other Ambulatory Visit: Payer: Self-pay | Admitting: Internal Medicine

## 2023-10-14 DIAGNOSIS — J849 Interstitial pulmonary disease, unspecified: Secondary | ICD-10-CM

## 2023-10-14 DIAGNOSIS — Z0000 Encounter for general adult medical examination without abnormal findings: Secondary | ICD-10-CM

## 2023-11-05 ENCOUNTER — Ambulatory Visit
Admission: RE | Admit: 2023-11-05 | Discharge: 2023-11-05 | Disposition: A | Discharge status: Home / Self Care | Payer: 59 | Source: Ambulatory Visit | Attending: Internal Medicine | Admitting: Internal Medicine

## 2023-11-05 ENCOUNTER — Other Ambulatory Visit: Payer: 59

## 2023-11-05 DIAGNOSIS — J849 Interstitial pulmonary disease, unspecified: Secondary | ICD-10-CM | POA: Insufficient documentation

## 2023-11-05 DIAGNOSIS — Z0000 Encounter for general adult medical examination without abnormal findings: Secondary | ICD-10-CM | POA: Insufficient documentation

## 2023-11-05 MED ORDER — IOHEXOL 300 MG/ML  SOLN
75.0000 mL | Freq: Once | INTRAMUSCULAR | Status: AC | PRN
  Administered 2023-11-05: 75 mL via INTRAVENOUS

## 2024-02-26 ENCOUNTER — Other Ambulatory Visit: Payer: Self-pay | Admitting: Rheumatology

## 2024-02-26 DIAGNOSIS — R058 Other specified cough: Secondary | ICD-10-CM

## 2024-02-26 DIAGNOSIS — R768 Other specified abnormal immunological findings in serum: Secondary | ICD-10-CM

## 2024-02-26 DIAGNOSIS — J849 Interstitial pulmonary disease, unspecified: Secondary | ICD-10-CM

## 2024-03-09 ENCOUNTER — Encounter: Payer: Self-pay | Admitting: Rheumatology

## 2024-03-15 ENCOUNTER — Ambulatory Visit
Admission: RE | Admit: 2024-03-15 | Discharge: 2024-03-15 | Disposition: A | Discharge status: Home / Self Care | Source: Ambulatory Visit | Attending: Rheumatology | Admitting: Rheumatology

## 2024-03-15 DIAGNOSIS — R768 Other specified abnormal immunological findings in serum: Secondary | ICD-10-CM

## 2024-03-15 DIAGNOSIS — R058 Other specified cough: Secondary | ICD-10-CM

## 2024-03-15 DIAGNOSIS — J849 Interstitial pulmonary disease, unspecified: Secondary | ICD-10-CM

## 2024-09-14 ENCOUNTER — Encounter: Payer: Self-pay | Admitting: Rheumatology

## 2024-09-14 ENCOUNTER — Other Ambulatory Visit: Payer: Self-pay | Admitting: Rheumatology

## 2024-09-14 DIAGNOSIS — J849 Interstitial pulmonary disease, unspecified: Secondary | ICD-10-CM

## 2024-09-19 ENCOUNTER — Ambulatory Visit
Admission: RE | Admit: 2024-09-19 | Discharge: 2024-09-19 | Disposition: A | Source: Ambulatory Visit | Attending: Rheumatology | Admitting: Rheumatology

## 2024-09-19 DIAGNOSIS — J849 Interstitial pulmonary disease, unspecified: Secondary | ICD-10-CM

## 2024-10-27 ENCOUNTER — Other Ambulatory Visit: Payer: Self-pay | Admitting: Unknown Physician Specialty

## 2024-10-27 DIAGNOSIS — M792 Neuralgia and neuritis, unspecified: Secondary | ICD-10-CM

## 2024-11-08 ENCOUNTER — Ambulatory Visit
Admission: RE | Admit: 2024-11-08 | Discharge: 2024-11-08 | Disposition: A | Discharge status: Home / Self Care | Source: Ambulatory Visit | Attending: Unknown Physician Specialty | Admitting: Unknown Physician Specialty

## 2024-11-08 DIAGNOSIS — M792 Neuralgia and neuritis, unspecified: Secondary | ICD-10-CM

## 2024-11-08 MED ORDER — GADOPICLENOL 0.5 MMOL/ML IV SOLN
10.0000 mL | Freq: Once | INTRAVENOUS | Status: AC | PRN
  Administered 2024-11-08: 10 mL via INTRAVENOUS
